# Patient Record
Sex: Female | Born: 1950 | Race: White | Hispanic: No | Marital: Married | Smoking: Never smoker
Health system: Southern US, Community
[De-identification: ages and names within clinical notes are randomized; demographics above are authoritative.]

## PROBLEM LIST (undated history)

## (undated) DIAGNOSIS — M5126 Other intervertebral disc displacement, lumbar region: Secondary | ICD-10-CM

## (undated) DIAGNOSIS — M1990 Unspecified osteoarthritis, unspecified site: Secondary | ICD-10-CM

## (undated) DIAGNOSIS — I10 Essential (primary) hypertension: Secondary | ICD-10-CM

## (undated) DIAGNOSIS — I480 Paroxysmal atrial fibrillation: Secondary | ICD-10-CM

## (undated) DIAGNOSIS — I509 Heart failure, unspecified: Secondary | ICD-10-CM

## (undated) DIAGNOSIS — E119 Type 2 diabetes mellitus without complications: Secondary | ICD-10-CM

## (undated) HISTORY — PX: ABDOMINAL HYSTERECTOMY: SHX81

## (undated) HISTORY — DX: Paroxysmal atrial fibrillation: I48.0

---

## 1999-09-03 ENCOUNTER — Ambulatory Visit (HOSPITAL_COMMUNITY): Admission: RE | Admit: 1999-09-03 | Discharge: 1999-09-03 | Payer: Self-pay | Admitting: Orthopedic Surgery

## 1999-09-03 ENCOUNTER — Encounter: Payer: Self-pay | Admitting: Orthopedic Surgery

## 2001-07-01 ENCOUNTER — Emergency Department (HOSPITAL_COMMUNITY): Admission: EM | Admit: 2001-07-01 | Discharge: 2001-07-01 | Payer: Self-pay | Admitting: *Deleted

## 2001-07-01 ENCOUNTER — Encounter: Payer: Self-pay | Admitting: *Deleted

## 2005-04-11 ENCOUNTER — Ambulatory Visit (HOSPITAL_COMMUNITY): Admission: RE | Admit: 2005-04-11 | Discharge: 2005-04-11 | Payer: Self-pay | Admitting: Orthopedic Surgery

## 2006-05-20 ENCOUNTER — Emergency Department (HOSPITAL_COMMUNITY): Admission: EM | Admit: 2006-05-20 | Discharge: 2006-05-20 | Payer: Self-pay | Admitting: Emergency Medicine

## 2007-10-25 ENCOUNTER — Ambulatory Visit (HOSPITAL_COMMUNITY): Admission: RE | Admit: 2007-10-25 | Discharge: 2007-10-25 | Payer: Self-pay | Admitting: Orthopaedic Surgery

## 2008-02-15 HISTORY — PX: JOINT REPLACEMENT: SHX530

## 2008-08-14 ENCOUNTER — Emergency Department (HOSPITAL_COMMUNITY): Admission: EM | Admit: 2008-08-14 | Discharge: 2008-08-14 | Payer: Self-pay | Admitting: Emergency Medicine

## 2008-11-17 ENCOUNTER — Inpatient Hospital Stay (HOSPITAL_COMMUNITY): Admission: RE | Admit: 2008-11-17 | Discharge: 2008-11-19 | Payer: Self-pay | Admitting: Orthopedic Surgery

## 2010-02-14 HISTORY — PX: JOINT REPLACEMENT: SHX530

## 2010-05-20 LAB — BASIC METABOLIC PANEL
CO2: 27 mEq/L (ref 19–32)
CO2: 28 mEq/L (ref 19–32)
Calcium: 8.4 mg/dL (ref 8.4–10.5)
Calcium: 8.4 mg/dL (ref 8.4–10.5)
Chloride: 101 mEq/L (ref 96–112)
Creatinine, Ser: 0.53 mg/dL (ref 0.4–1.2)
Creatinine, Ser: 0.59 mg/dL (ref 0.4–1.2)
GFR calc Af Amer: >60 mL/min (ref >60)
GFR calc Af Amer: >60 mL/min (ref >60)
GFR calc non Af Amer: >60 mL/min (ref >60)
Glucose, Bld: 101 mg/dL — ABNORMAL HIGH (ref 70–99)
Glucose, Bld: 94 mg/dL (ref 70–99)
Sodium: 133 mEq/L — ABNORMAL LOW (ref 135–145)

## 2010-05-20 LAB — TYPE AND SCREEN
ABO/RH(D): A POS
Antibody Screen: NEGATIVE

## 2010-05-20 LAB — CBC
MCHC: 33.9 g/dL (ref 30.0–36.0)
MCHC: 34 g/dL (ref 30.0–36.0)
MCV: 90.6 fL (ref 78.0–100.0)
RBC: 3.48 MIL/uL — ABNORMAL LOW (ref 3.87–5.11)
RBC: 3.82 MIL/uL — ABNORMAL LOW (ref 3.87–5.11)
RDW: 12.1 % (ref 11.5–15.5)
RDW: 12.5 % (ref 11.5–15.5)

## 2010-05-20 LAB — GLUCOSE, CAPILLARY: Glucose-Capillary: 107 mg/dL — ABNORMAL HIGH (ref 70–99)

## 2010-05-21 LAB — URINALYSIS, ROUTINE W REFLEX MICROSCOPIC
Bilirubin Urine: NEGATIVE
Nitrite: NEGATIVE
Specific Gravity, Urine: 1.021 (ref 1.005–1.030)
Urobilinogen, UA: 1 mg/dL (ref 0.0–1.0)

## 2010-05-21 LAB — BASIC METABOLIC PANEL
Calcium: 9.3 mg/dL (ref 8.4–10.5)
Creatinine, Ser: 0.58 mg/dL (ref 0.4–1.2)
GFR calc Af Amer: >60 mL/min (ref >60)
GFR calc non Af Amer: >60 mL/min (ref >60)
Sodium: 137 mEq/L (ref 135–145)

## 2010-05-21 LAB — PROTIME-INR: INR: 1 (ref 0.00–1.49)

## 2010-05-21 LAB — DIFFERENTIAL
Basophils Relative: 1 % (ref 0–1)
Lymphocytes Relative: 24 % (ref 12–46)
Lymphs Abs: 1.7 10*3/uL (ref 0.7–4.0)
Monocytes Relative: 6 % (ref 3–12)
Neutro Abs: 4.8 10*3/uL (ref 1.7–7.7)
Neutrophils Relative %: 67 % (ref 43–77)

## 2010-05-21 LAB — APTT: aPTT: 27 seconds (ref 24–37)

## 2010-05-21 LAB — CBC
Hemoglobin: 14.2 g/dL (ref 12.0–15.0)
RBC: 4.59 MIL/uL (ref 3.87–5.11)
WBC: 7.2 10*3/uL (ref 4.0–10.5)

## 2010-05-23 LAB — COMPREHENSIVE METABOLIC PANEL
ALT: 26 U/L (ref 0–35)
AST: 27 U/L (ref 0–37)
CO2: 26 mEq/L (ref 19–32)
Calcium: 10.1 mg/dL (ref 8.4–10.5)
GFR calc Af Amer: >60 mL/min (ref >60)
GFR calc non Af Amer: >60 mL/min (ref >60)
Sodium: 141 mEq/L (ref 135–145)
Total Protein: 7.2 g/dL (ref 6.0–8.3)

## 2010-05-23 LAB — DIFFERENTIAL
Eosinophils Absolute: 0.1 10*3/uL (ref 0.0–0.7)
Eosinophils Relative: 1 % (ref 0–5)
Lymphs Abs: 1.5 10*3/uL (ref 0.7–4.0)
Monocytes Relative: 3 % (ref 3–12)

## 2010-05-23 LAB — URINALYSIS, ROUTINE W REFLEX MICROSCOPIC
Bilirubin Urine: NEGATIVE
Glucose, UA: NEGATIVE mg/dL
Hgb urine dipstick: NEGATIVE
Ketones, ur: NEGATIVE mg/dL
pH: 6.5 (ref 5.0–8.0)

## 2010-05-23 LAB — CBC
MCHC: 34.2 g/dL (ref 30.0–36.0)
RBC: 4.82 MIL/uL (ref 3.87–5.11)

## 2010-11-25 ENCOUNTER — Other Ambulatory Visit: Payer: Self-pay | Admitting: Orthopedic Surgery

## 2010-11-25 ENCOUNTER — Encounter (HOSPITAL_COMMUNITY): Payer: BC Managed Care – PPO

## 2010-11-25 LAB — PROTIME-INR: INR: 0.99 (ref 0.00–1.49)

## 2010-11-25 LAB — CBC
HCT: 41.8 % (ref 36.0–46.0)
Hemoglobin: 14.1 g/dL (ref 12.0–15.0)
MCHC: 33.7 g/dL (ref 30.0–36.0)
RBC: 4.84 MIL/uL (ref 3.87–5.11)

## 2010-11-25 LAB — DIFFERENTIAL
Basophils Absolute: 0 10*3/uL (ref 0.0–0.1)
Lymphocytes Relative: 27 % (ref 12–46)
Monocytes Absolute: 0.6 10*3/uL (ref 0.1–1.0)
Monocytes Relative: 7 % (ref 3–12)
Neutro Abs: 5.7 10*3/uL (ref 1.7–7.7)

## 2010-11-25 LAB — BASIC METABOLIC PANEL
Calcium: 9.4 mg/dL (ref 8.4–10.5)
Chloride: 103 mEq/L (ref 96–112)
Creatinine, Ser: 0.58 mg/dL (ref 0.50–1.10)
GFR calc Af Amer: >90 mL/min (ref >90)
GFR calc non Af Amer: >90 mL/min (ref >90)

## 2010-11-25 LAB — URINALYSIS, ROUTINE W REFLEX MICROSCOPIC
Bilirubin Urine: NEGATIVE
Hgb urine dipstick: NEGATIVE
Nitrite: NEGATIVE
Specific Gravity, Urine: 1.027 (ref 1.005–1.030)
pH: 5.5 (ref 5.0–8.0)

## 2010-11-25 LAB — SURGICAL PCR SCREEN: MRSA, PCR: NEGATIVE

## 2010-11-29 ENCOUNTER — Inpatient Hospital Stay (HOSPITAL_COMMUNITY)
Admission: RE | Admit: 2010-11-29 | Discharge: 2010-12-01 | DRG: 209 | Disposition: A | Discharge status: Home Health | Payer: BC Managed Care – PPO | Source: Ambulatory Visit | Attending: Orthopedic Surgery | Admitting: Orthopedic Surgery

## 2010-11-29 DIAGNOSIS — Z01812 Encounter for preprocedural laboratory examination: Secondary | ICD-10-CM

## 2010-11-29 DIAGNOSIS — I10 Essential (primary) hypertension: Secondary | ICD-10-CM | POA: Diagnosis present

## 2010-11-29 DIAGNOSIS — M1710 Unilateral primary osteoarthritis, unspecified knee: Principal | ICD-10-CM | POA: Diagnosis present

## 2010-11-29 DIAGNOSIS — Z96649 Presence of unspecified artificial hip joint: Secondary | ICD-10-CM

## 2010-11-29 LAB — TYPE AND SCREEN
ABO/RH(D): A POS
Antibody Screen: NEGATIVE

## 2010-11-29 NOTE — H&P (Signed)
  [name], [name]               ACCOUNT NO.:  [id number]  MEDICAL RECORD NO.:  [id number]  LOCATION:                               FACILITY:  [facility]  PHYSICIAN:  Madlyn Frankel. Charlann Boxer, M.D.  DATE OF BIRTH:  1950-12-02  DATE OF ADMISSION:  11/29/2010 DATE OF DISCHARGE:                             HISTORY & PHYSICAL   CHIEF COMPLAINT:  Right knee osteoarthritis.  HISTORY OF PRESENT ILLNESS:  This is a 60-year lady with a history of osteoarthritis of right knee with failure of conservative treatment to manage her pain.  After discussion of treatments, benefits, risks and options, the patient now scheduled for total knee arthroplasty of the right knee.  Note that she is a candidate for tranexamic acid and will received that in preop.  She will be going home after surgery.  Her medical doctor is Dr. Herb Grays.  DRUG ALLERGIES:  None.  CURRENT MEDICATIONS: 1. Lisinopril once a day. 2. Relafen b.i.d.  SERIOUS MEDICAL ILLNESSES:  Include hypertension.  PREVIOUS SURGERIES:  Include right knee arthroscopy, total abdominal hysterectomy, and right total hip arthroplasty.  FAMILY HISTORY:  Unknown as she was adopted.  SOCIAL HISTORY:  The patient is married.  She works as a Chiropodist.  She does not smoke and does not drink.  REVIEW OF SYSTEMS:  CENTRAL NERVOUS SYSTEM:  Positive for impaired vision, otherwise negative.  PULMONARY:  Negative shortness of breath, PND, or orthopnea.  CARDIOVASCULAR:  Positive for hypertension. Negative for chest pain or palpitation.  GI:  Negative for ulcers or hepatitis.  GU:  Negative for urinary tract difficulty. MUSCULOSKELETAL:  Positive in HPI.  PHYSICAL EXAMINATION:  VITAL SIGNS:  BP 148/78, respirations 16, pulse 68 and regular. GENERAL APPEARANCE:  This is a well-developed and well-nourished lady, in no acute distress. HEENT:  Head normocephalic.  Nose patent.  Ears patent.  Pupils are equal, round, and reactive to light.   Throat without injection. NECK:  Supple without adenopathy.  Carotids 2+ without bruit. CHEST:  Clear to auscultation.  No rales or rhonchi.  Respirations 16. HEART:  Pulse 68 beats per minute without murmur. ABDOMEN:  Soft with active bowel sounds.  No masses or organomegaly. NEUROLOGIC:  The patient is alert and oriented to time, place, and person.  Cranial nerves II through XII grossly intact. EXTREMITIES:  Show the right knee with 2 degrees from full extension, further flexion of 125 degrees.  There is crepitation throughout the range of motion.  Neurovascular status intact.  IMPRESSION:  Right knee osteoarthritis.  PLAN:  Total knee arthroplasty of right knee.     Jaquelyn Bitter. Chabon, P.A.   ______________________________ Madlyn Frankel Charlann Boxer, M.D.    SJC/MEDQ  D:  11/17/2010  T:  11/17/2010  Job:  [id number]  Electronically Signed by Jodene Nam P.A. on 11/24/2010 08:14:59 AM Electronically Signed by Durene Romans M.D. on 11/29/2010 08:52:24 AM

## 2010-11-30 LAB — BASIC METABOLIC PANEL
Calcium: 8.6 mg/dL (ref 8.4–10.5)
Creatinine, Ser: <0.47 mg/dL — ABNORMAL LOW (ref 0.50–1.10)
Sodium: 136 mEq/L (ref 135–145)

## 2010-11-30 LAB — CBC
MCH: 29.7 pg (ref 26.0–34.0)
MCHC: 34.4 g/dL (ref 30.0–36.0)
Platelets: 141 10*3/uL — ABNORMAL LOW (ref 150–400)
RBC: 4.18 MIL/uL (ref 3.87–5.11)
RDW: 13.1 % (ref 11.5–15.5)

## 2010-12-01 LAB — CBC
HCT: 33.5 % — ABNORMAL LOW (ref 36.0–46.0)
Hemoglobin: 11.5 g/dL — ABNORMAL LOW (ref 12.0–15.0)
MCH: 29.3 pg (ref 26.0–34.0)
MCHC: 34.3 g/dL (ref 30.0–36.0)
MCV: 85.5 fL (ref 78.0–100.0)
Platelets: 134 10*3/uL — ABNORMAL LOW (ref 150–400)
RBC: 3.92 MIL/uL (ref 3.87–5.11)
RDW: 13 % (ref 11.5–15.5)
WBC: 9 10*3/uL (ref 4.0–10.5)

## 2010-12-01 LAB — BASIC METABOLIC PANEL
CO2: 30 mEq/L (ref 19–32)
Chloride: 98 mEq/L (ref 96–112)
GFR calc non Af Amer: >90 mL/min (ref >90)
Glucose, Bld: 100 mg/dL — ABNORMAL HIGH (ref 70–99)
Potassium: 4.6 mEq/L (ref 3.5–5.1)
Sodium: 132 mEq/L — ABNORMAL LOW (ref 135–145)

## 2010-12-06 NOTE — Discharge Summary (Signed)
[name], [name]               ACCOUNT NO.:  [id number]  MEDICAL RECORD NO.:  [id number]  LOCATION:  1614                         FACILITY:  [facility]  PHYSICIAN:  Madlyn Frankel. Charlann Boxer, M.D.  DATE OF BIRTH:  09-13-1950  DATE OF ADMISSION:  11/29/2010 DATE OF DISCHARGE:  12/01/2010                              DISCHARGE SUMMARY   PROCEDURE:  Right total knee replacement.  ATTENDING PHYSICIAN:  Madlyn Frankel. Charlann Boxer, M.D.  ADMITTING DIAGNOSIS:  Right knee osteoarthritis.  DISCHARGE DIAGNOSES: 1. Status post right total knee arthroplasty. 2. Hypertension.  HISTORY OF PRESENT ILLNESS:  [name] is a 60 year old lady with a history of osteoarthritis of Stacy right knee.  [name] has tried several conservative treatments, all of which failed to manage her pain. X-rays in Stacy clinic do show osteoarthritis of Stacy right knee.  Various options were discussed with [name].  Due to failure of conservative treatment and Stacy findings on x-ray, [name] wished to proceed with surgery.  Risks, benefits, and expectations of procedure were discussed with [name].  [name] understands risks, benefits, and expectations and wishes to proceed with a right total knee arthroplasty per Dr. Charlann Boxer.  HOSPITAL COURSE:  [name] underwent Stacy above-stated procedure on October 30, 2010.  [name] tolerated Stacy procedure well, was brought to Stacy recovery room in good condition and subsequently to Stacy floor.  Postop day 1, November 30, 2010, [name] doing well, and was okay.  Afebrile, vital signs stable.  H and H is 12.4/36.0.  Distally neurovascular intact.  Dressings appear clean, dry, and intact.  Hemovac drain was removed.  IV access with saline lock.  [name] has physical therapy.  On postop day 2, December 01, 2010, [name] doing well.  No events. Pain is controlled with medication.  H and H is 11.5/32.5.  She is afebrile.  Vital signs stable.  Dressings appear clean,  dry, and intact. She is distally neurovascularly intact.  [name] had physical therapy and felt to be doing well enough to be discharged home.  DISCHARGE CONDITION:  Good.  DISCHARGE INSTRUCTIONS:  [name] will be discharged home with home health physical therapy.  [name] will be weightbearing as tolerated.  [name] maintain her surgical dressing for about 8 days after which time she will replace with gauze and tape.  [name] is to keep Stacy area dry and clean until followup.  Follow up with [facility] in 2 weeks.  [name] is to call with any questions or concerns.  DISCHARGE MEDICATIONS: 1. Enteric-coated aspirin 325 mg 1 p.o. b.i.d. for 4 weeks. 2. Tylenol 1000 mg q.8 hours p.r.n. pain. 3. Benadryl 25 mg 1 p.o. q.4 hours p.r.n. 4. Colace 100 mg 1 p.o. b.i.d. constipation. 5. Iron sulfate 325 mg 1 p.o. t.i.d. for 2 to 3 weeks. 6. Robaxin 500 mg 1 p.o. q.6 hours p.r.n. muscle spasms. 7. Oxycodone 5 mg 1 to 3 every 4 hours p.r.n. pain. 8. MiraLax 17 g p.o. q. day Constipation. 9. Lisinopril/hydrochlorothiazide 20/12.5 mg 1 p.o. q.a.m.    ______________________________ Lanney Gins, PA   ______________________________ Madlyn Frankel. Charlann Boxer, M.D.    MB/MEDQ  D:  12/01/2010  T:  12/01/2010  Job:  [id number]  cc:   Tammy R. Collins [place], M.D. Fax: [phone]  Electronically Signed by Lanney Gins PA on 12/01/2010 05:00:26 PM Electronically Signed by Durene Romans M.D. on 12/06/2010 12:38:29 PM

## 2010-12-06 NOTE — Op Note (Signed)
[name], [name]               ACCOUNT NO.:  [id number]  MEDICAL RECORD NO.:  [id number]  LOCATION:  1614                         FACILITY:  [facility]  PHYSICIAN:  Madlyn Frankel. Charlann Boxer, M.D.  DATE OF BIRTH:  [birth year]  DATE OF PROCEDURE:  11/29/2010 DATE OF DISCHARGE:                              OPERATIVE REPORT   PREOPERATIVE DIAGNOSIS:  Right knee osteoarthritis.  POSTOPERATIVE DIAGNOSIS:  Right knee osteoarthritis.  PROCEDURE:  Right total knee replacement.  COMPONENTS USED:  DePuy rotating platform stabilized knee system with size 3 femur, 2.5 tibia, 12.5 insert, and 38 patellar button.  SURGEON:  Madlyn Frankel. Charlann Boxer, M.D.  ASSISTANT:  Lanney Gins, PA-C.  ANESTHESIA:  Spinal.  SPECIMENS:  None.  COMPLICATION:  None.  DRAINS:  One Hemovac.  TOURNIQUET TIME:  26 minutes at 250 mmHg.  INDICATION FOR PROCEDURE:  Ms. [name] is a 60 year old female with bilateral knee osteoarthritis.  Radiographically, there is significant bone-on-bone changes.  She had failed conservative measures and was ready to proceed with more definitive measures.  After reviewing the risks of infection, DVT, component failure, need for revision surgery, she was at this point ready to pursue more definitive measures.  Consent was obtained for benefit of pain, relief for knee replacement.  PROCEDURE IN DETAIL:  The patient was brought to the operative theater. Once adequate anesthesia, preoperative antibiotics, Ancef administered, she was positioned supine with a right thigh tourniquet placed.  The right lower extremity was prepped and draped in sterile fashion.  Time- out was performed identifying the patient, planned procedure, and extremity.  Leg was exsanguinated, tourniquet elevated to 250 mmHg.  Following a midline incision, a proximal median arthrotomy and initial exposure and debridement was carried out.  First attention was directed to patella, noted significant bone-on-bone changes to  the medial compartment, patellofemoral compartment, and lateral compartment.  I resected down to about 13 mm of the patella.  This cut surface seemed to be best fit with a size 38 patellar button.  Lug holes were drilled and a metal shim placed to protect the patella from retractors and saw blades.  Attention was now directed to the femur.  Femoral canal was opened with a drill, irrigated to try to prevent fat emboli.  An intramedullary rod was passed at 3 degrees of valgus, I resected 10 mm of bone off the distal femur.  Following this resection, the tibia was subluxated anteriorly, allowing for cruciate and meniscus debridement.  Using extramedullary guide, I resected and measured 10 mm bone of the proximal lateral tibia of the less affected side.  I then checked to confirm the gap was stable medially and laterally, and seemed to be balanced at this point.  I then checked the alignment using alignment rod, found that it was nice and stable __________ down the tibial crest.  At this point, we sized the femur to be a size 3 in anterior-posterior dimension.  The size 3 rotation block was then pinned into position with anterior reference using the C-clamp to set rotation of the proximal tibial cut.  The four-in-one cutting block was then pinned into position.  Anterior, posterior, and chamfer cuts were made without difficulty, no notching.  Final box cut was made off the lateral aspect of distal femur. Following this resection, the tibia was re-subluxated anteriorly and the tibial cut surface seemed to be best fit with size 2.5 tray was pinned into position, drilled, and keel punched.  Trial reduction now carried out with 3 femur, 2.5 tibia, and 10 mm insert and with this, I felt that it was just a little bit of hyper extension, but was stable from extension to flexion.  The patella tracked through the __________ without application of pressure.  At this point, trial components  were removed from the knee.  The synovial capsule junction of the knee was injected with 0.25% Marcaine with epinephrine and 1 mL of Toradol.  Final components were opened. The knee was irrigated with normal saline solution and pulse lavaged.  The final components were then cemented onto clean and dried cut surfaces of bone with the knee brought into extension with a 12.5 insert, with this the knee came to full extension.  Extruded cement was removed.  Once the cement had fully cured, the excess cement was removed throughout the knee, the final 12.5 insert was chosen.  The final insert was chosen and placed into the knee.  The knee was re-irrigated with saline solution and pulse lavaged.  At this point, a medium Hemovac drain was placed deep with extensor mechanism, was then reapproximated using #1 Vicryl with the knee in flexion.  The remainder of the wound was closed with 2-0 Vicryl and running 4-0 Monocryl.  The knee was cleaned, dried, and dressed sterilely using Dermabond and Aquacel dressing, drain site dressed separately.  The knee was then wrapped in Ace wrap.  She was then brought to recovery room in stable condition, tolerating the procedure well.  Lanney Gins, physician assistant, was present for the entirety of the case, __________ important for preoperative positioning, perioperative retractor management, general facilitation of the case as well as primary wound closure at the end of the case.     Madlyn Frankel Charlann Boxer, M.D.     MDO/MEDQ  D:  11/29/2010  T:  11/29/2010  Job:  [id number]  Electronically Signed by Durene Romans M.D. on 12/06/2010 12:38:24 PM

## 2013-02-21 ENCOUNTER — Other Ambulatory Visit (HOSPITAL_COMMUNITY): Payer: Self-pay | Admitting: Family Medicine

## 2013-02-21 DIAGNOSIS — Z1231 Encounter for screening mammogram for malignant neoplasm of breast: Secondary | ICD-10-CM

## 2013-02-25 ENCOUNTER — Ambulatory Visit (HOSPITAL_COMMUNITY)
Admission: RE | Admit: 2013-02-25 | Discharge: 2013-02-25 | Disposition: A | Discharge status: Home / Self Care | Payer: BC Managed Care – PPO | Source: Ambulatory Visit | Attending: Family Medicine | Admitting: Family Medicine

## 2013-02-25 DIAGNOSIS — Z1231 Encounter for screening mammogram for malignant neoplasm of breast: Secondary | ICD-10-CM | POA: Insufficient documentation

## 2013-02-27 ENCOUNTER — Other Ambulatory Visit: Payer: Self-pay | Admitting: Family Medicine

## 2013-02-27 DIAGNOSIS — R928 Other abnormal and inconclusive findings on diagnostic imaging of breast: Secondary | ICD-10-CM

## 2013-03-06 ENCOUNTER — Ambulatory Visit (HOSPITAL_COMMUNITY)
Admission: RE | Admit: 2013-03-06 | Discharge: 2013-03-06 | Disposition: A | Discharge status: Home / Self Care | Payer: BC Managed Care – PPO | Source: Ambulatory Visit | Attending: Family Medicine | Admitting: Family Medicine

## 2013-03-06 DIAGNOSIS — Z1231 Encounter for screening mammogram for malignant neoplasm of breast: Secondary | ICD-10-CM | POA: Insufficient documentation

## 2013-03-06 DIAGNOSIS — R928 Other abnormal and inconclusive findings on diagnostic imaging of breast: Secondary | ICD-10-CM

## 2013-03-27 ENCOUNTER — Encounter (HOSPITAL_COMMUNITY): Payer: Self-pay | Admitting: Pharmacy Technician

## 2013-03-27 ENCOUNTER — Other Ambulatory Visit (HOSPITAL_COMMUNITY): Payer: Self-pay | Admitting: Orthopedic Surgery

## 2013-03-27 NOTE — Patient Instructions (Addendum)
      Your procedure is scheduled on:  04/04/13  Thursday  Report [facility] at    1000   AM.  Call this number if you have problems the morning of surgery: [phone]       Do not eat food  :After Midnight. Wednesday NIGHT--- MAY HAVE CLEAR LIQUIDS Thursday MORNING UNTIL 0630am- THEN NONE   Take these medicines the morning of surgery with A SIP OF WATER:No regular meds                         May take Hydrocodone if needed   .  Contacts, dentures or partial plates, or metal hairpins  can not be worn to surgery. Your family will be responsible for glasses, dentures, hearing aides while you are in surgery  Leave suitcase in the car. After surgery it may be brought to your room.  For patients admitted to the hospital, checkout time is 11:00 AM day of  discharge.                DO NOT WEAR JEWELRY, LOTIONS, POWDERS, OR PERFUMES.  WOMEN-- DO NOT SHAVE LEGS OR UNDERARMS FOR 48 HOURS BEFORE SHOWERS. MEN MAY SHAVE FACE.  Patients discharged the day of surgery will not be allowed to drive home. IF going home the day of surgery, you must have a driver and someone to stay with you for the first 24 hours  Name and phone number of your driver:        Overnight stay    Then husband                                                            Please read over the following fact sheets that you were given: MRSA Information, Incentive Spirometry Sheet    Paxtyn Boyar  PST 336  [phone]                 FAILURE TO FOLLOW THESE INSTRUCTIONS MAY RESULT IN  CANCELLATION   OF YOUR SURGERY                                                  Patient Signature _____________________________

## 2013-03-27 NOTE — H&P (Signed)
[name] is an 63 y.o. female.   Chief Complaint: back pain HPI: The patient is a 63 year old female who presented with the chief complaint of low back pain. She has been having progressively worsening pain that radiates into her left LE for 6 months. She denies numbness and tingling as well as change in bladder or bowel function. 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   Past Medical History Hypertension  Family History Family history unknown - Adopted  Social History Marital status. married Living situation. live with spouse Tobacco use. never smoker Tobacco / smoke exposure. no Children. 2 Alcohol use. never consumed alcohol Exercise. Exercises monthly; does running / walking Current work status. working full time  Past Surgical History Total Hip Replacement. right Tubal Ligation Arthroscopy of Knee. right Hysterectomy. complete (non-cancerous)   Allergies: No Known Allergies   Current outpatient prescriptions: HYDROcodone-acetaminophen (NORCO/VICODIN) 5-325 MG per tablet, Take 1 tablet by mouth every 6 (six) hours as needed for moderate pain., Disp: , Rfl: ;  lisinopril-hydrochlorothiazide (PRINZIDE,ZESTORETIC) 10-12.5 MG per tablet, Take 1 tablet by mouth every morning., Disp: , Rfl:   Review of Systems  Constitutional: Negative.   HENT: Negative.   Eyes: Negative.   Respiratory: Negative.   Cardiovascular: Negative.   Gastrointestinal: Negative.   Genitourinary: Negative.   Musculoskeletal: Positive for back pain and joint pain. Negative for falls, myalgias and neck pain.  Skin: Negative.   Neurological: Negative.   Endo/Heme/Allergies: Negative.   Psychiatric/Behavioral: Negative.    Vitals Weight: 242 lb Height: 65.5 in Body Surface Area: 2.25 m Body Mass Index: 39.66 kg/m Pulse: 120 (Regular) BP: 183/107 (Sitting, Left Arm, Standard)  Physical Exam  Constitutional: She is oriented to person, place,  and time. She appears well-developed and well-nourished. No distress.  HENT:  Head: Normocephalic and atraumatic.  Right Ear: External ear normal.  Left Ear: External ear normal.  Nose: Nose normal.  Mouth/Throat: Oropharynx is clear and moist.  Eyes: Conjunctivae and EOM are normal.  Cardiovascular: Normal rate, regular rhythm, normal heart sounds and intact distal pulses.   No murmur heard. Respiratory: Effort normal and breath sounds normal. No respiratory distress. She has no wheezes.  GI: Soft. Bowel sounds are normal. She exhibits no distension. There is no tenderness.  Musculoskeletal:       Right hip: Normal.       Left hip: Normal.       Right knee: Normal.       Left knee: Normal.       Lumbar back: She exhibits decreased range of motion, tenderness, pain and spasm.       Right lower leg: She exhibits no tenderness and no swelling.       Left lower leg: She exhibits no tenderness and no swelling.  Positive SLR on the left  Neurological: She is alert and oriented to person, place, and time. She has normal strength and normal reflexes. No sensory deficit.  Skin: No rash noted. She is not diaphoretic. No erythema.  Psychiatric: She has a normal mood and affect. Her behavior is normal.     Assessment/Plan Lumbar disc herniation L5-S1 left She needs a lumbar hemilaminectomy and microdiscectomy at L5-S1 on the left. Risks and benefits of the surgery were discussed with the patient by Dr. Darrelyn Hillock. The possible complications of spinal surgery number one could be infection, which is extremely rare. We do use antibiotics prior to the surgery and during surgery and after surgery.  Number two is always a slight degree of probability that you could develop a blood clot in your leg after any type of surgery and we try our best to prevent that with aspirin post op when it is safe to begin. The third is a dural leak. That is the spinal fluid leak that could occur. At certain rare times the bone  or the disc could literally stick to the dura which is the lining which contains the spinal fluid and we could develop a small tear in that lining which we then patch up. That is an extremely rare complication. The last and final complication is a recurrent disc rupture. That means that you could rupture another small piece of disc later on down the road and there is about a 2% chance of that.   [name] 03/27/2013, 2:02 PM

## 2013-03-28 ENCOUNTER — Encounter (HOSPITAL_COMMUNITY): Payer: Self-pay

## 2013-03-28 ENCOUNTER — Encounter (HOSPITAL_COMMUNITY)
Admission: RE | Admit: 2013-03-28 | Discharge: 2013-03-28 | Disposition: A | Discharge status: Home / Self Care | Payer: BC Managed Care – PPO | Source: Ambulatory Visit | Attending: Orthopedic Surgery | Admitting: Orthopedic Surgery

## 2013-03-28 ENCOUNTER — Ambulatory Visit (HOSPITAL_COMMUNITY)
Admission: RE | Admit: 2013-03-28 | Discharge: 2013-03-28 | Disposition: A | Discharge status: Home / Self Care | Payer: BC Managed Care – PPO | Source: Ambulatory Visit | Attending: Surgical | Admitting: Surgical

## 2013-03-28 DIAGNOSIS — M5137 Other intervertebral disc degeneration, lumbosacral region: Secondary | ICD-10-CM | POA: Insufficient documentation

## 2013-03-28 DIAGNOSIS — Z01812 Encounter for preprocedural laboratory examination: Secondary | ICD-10-CM | POA: Insufficient documentation

## 2013-03-28 DIAGNOSIS — M51379 Other intervertebral disc degeneration, lumbosacral region without mention of lumbar back pain or lower extremity pain: Secondary | ICD-10-CM | POA: Insufficient documentation

## 2013-03-28 DIAGNOSIS — M545 Low back pain, unspecified: Secondary | ICD-10-CM | POA: Insufficient documentation

## 2013-03-28 DIAGNOSIS — I10 Essential (primary) hypertension: Secondary | ICD-10-CM | POA: Insufficient documentation

## 2013-03-28 DIAGNOSIS — I517 Cardiomegaly: Secondary | ICD-10-CM | POA: Insufficient documentation

## 2013-03-28 DIAGNOSIS — R9431 Abnormal electrocardiogram [ECG] [EKG]: Secondary | ICD-10-CM | POA: Insufficient documentation

## 2013-03-28 DIAGNOSIS — Z01810 Encounter for preprocedural cardiovascular examination: Secondary | ICD-10-CM | POA: Insufficient documentation

## 2013-03-28 HISTORY — DX: Other intervertebral disc displacement, lumbar region: M51.26

## 2013-03-28 HISTORY — DX: Unspecified osteoarthritis, unspecified site: M19.90

## 2013-03-28 HISTORY — DX: Essential (primary) hypertension: I10

## 2013-03-28 LAB — CBC
HEMATOCRIT: 46.7 % — AB (ref 36.0–46.0)
Hemoglobin: 16.6 g/dL — ABNORMAL HIGH (ref 12.0–15.0)
MCH: 30.2 pg (ref 26.0–34.0)
MCHC: 35.5 g/dL (ref 30.0–36.0)
MCV: 85.1 fL (ref 78.0–100.0)
Platelets: 198 10*3/uL (ref 150–400)
RBC: 5.49 MIL/uL — ABNORMAL HIGH (ref 3.87–5.11)
RDW: 13.2 % (ref 11.5–15.5)
WBC: 8.4 10*3/uL (ref 4.0–10.5)

## 2013-03-28 LAB — URINALYSIS, ROUTINE W REFLEX MICROSCOPIC
Bilirubin Urine: NEGATIVE
Glucose, UA: NEGATIVE mg/dL
Hgb urine dipstick: NEGATIVE
Ketones, ur: NEGATIVE mg/dL
Leukocytes, UA: NEGATIVE
Nitrite: NEGATIVE
Protein, ur: NEGATIVE mg/dL
Specific Gravity, Urine: 1.006 (ref 1.005–1.030)
Urobilinogen, UA: 0.2 mg/dL (ref 0.0–1.0)
pH: 7 (ref 5.0–8.0)

## 2013-03-28 LAB — COMPREHENSIVE METABOLIC PANEL
ALT: 24 U/L (ref 0–35)
AST: 24 U/L (ref 0–37)
Albumin: 4.2 g/dL (ref 3.5–5.2)
Alkaline Phosphatase: 74 U/L (ref 39–117)
BUN: 10 mg/dL (ref 6–23)
CO2: 23 mEq/L (ref 19–32)
Calcium: 9.4 mg/dL (ref 8.4–10.5)
Chloride: 96 mEq/L (ref 96–112)
Creatinine, Ser: 0.69 mg/dL (ref 0.50–1.10)
GFR calc Af Amer: >90 mL/min (ref >90)
GFR calc non Af Amer: >90 mL/min (ref >90)
Glucose, Bld: 101 mg/dL — ABNORMAL HIGH (ref 70–99)
Potassium: 4 mEq/L (ref 3.7–5.3)
Sodium: 135 mEq/L — ABNORMAL LOW (ref 137–147)
Total Bilirubin: 0.6 mg/dL (ref 0.3–1.2)
Total Protein: 7.4 g/dL (ref 6.0–8.3)

## 2013-03-28 LAB — APTT: aPTT: 34 seconds (ref 24–37)

## 2013-03-28 LAB — PROTIME-INR
INR: 1.01 (ref 0.00–1.49)
Prothrombin Time: 13.1 seconds (ref 11.6–15.2)

## 2013-03-28 LAB — SURGICAL PCR SCREEN
MRSA, PCR: NEGATIVE
Staphylococcus aureus: NEGATIVE

## 2013-03-28 NOTE — Progress Notes (Signed)
[facility]ax[facility] CBC [facility]

## 2013-04-04 ENCOUNTER — Encounter (HOSPITAL_COMMUNITY): Payer: Self-pay | Admitting: *Deleted

## 2013-04-04 ENCOUNTER — Encounter (HOSPITAL_COMMUNITY): Payer: BC Managed Care – PPO | Admitting: Certified Registered Nurse Anesthetist

## 2013-04-04 ENCOUNTER — Ambulatory Visit (HOSPITAL_COMMUNITY): Payer: BC Managed Care – PPO | Admitting: Certified Registered Nurse Anesthetist

## 2013-04-04 ENCOUNTER — Encounter (HOSPITAL_COMMUNITY)
Admission: RE | Disposition: A | Discharge status: Home / Self Care | Payer: Self-pay | Source: Ambulatory Visit | Attending: Orthopedic Surgery

## 2013-04-04 ENCOUNTER — Ambulatory Visit (HOSPITAL_COMMUNITY): Payer: BC Managed Care – PPO

## 2013-04-04 ENCOUNTER — Ambulatory Visit (HOSPITAL_COMMUNITY)
Admission: RE | Admit: 2013-04-04 | Discharge: 2013-04-05 | Disposition: A | Discharge status: Home / Self Care | Payer: BC Managed Care – PPO | Source: Ambulatory Visit | Attending: Orthopedic Surgery | Admitting: Orthopedic Surgery

## 2013-04-04 DIAGNOSIS — M48061 Spinal stenosis, lumbar region without neurogenic claudication: Secondary | ICD-10-CM | POA: Insufficient documentation

## 2013-04-04 DIAGNOSIS — Z6841 Body Mass Index (BMI) 40.0 and over, adult: Secondary | ICD-10-CM | POA: Insufficient documentation

## 2013-04-04 DIAGNOSIS — M5137 Other intervertebral disc degeneration, lumbosacral region: Secondary | ICD-10-CM | POA: Insufficient documentation

## 2013-04-04 DIAGNOSIS — M216X9 Other acquired deformities of unspecified foot: Secondary | ICD-10-CM | POA: Insufficient documentation

## 2013-04-04 DIAGNOSIS — I10 Essential (primary) hypertension: Secondary | ICD-10-CM | POA: Insufficient documentation

## 2013-04-04 DIAGNOSIS — M519 Unspecified thoracic, thoracolumbar and lumbosacral intervertebral disc disorder: Secondary | ICD-10-CM | POA: Insufficient documentation

## 2013-04-04 DIAGNOSIS — Z96649 Presence of unspecified artificial hip joint: Secondary | ICD-10-CM | POA: Insufficient documentation

## 2013-04-04 DIAGNOSIS — M5126 Other intervertebral disc displacement, lumbar region: Secondary | ICD-10-CM | POA: Insufficient documentation

## 2013-04-04 DIAGNOSIS — M1610 Unilateral primary osteoarthritis, unspecified hip: Secondary | ICD-10-CM | POA: Insufficient documentation

## 2013-04-04 DIAGNOSIS — M48062 Spinal stenosis, lumbar region with neurogenic claudication: Secondary | ICD-10-CM

## 2013-04-04 DIAGNOSIS — R29898 Other symptoms and signs involving the musculoskeletal system: Secondary | ICD-10-CM | POA: Insufficient documentation

## 2013-04-04 DIAGNOSIS — Z90710 Acquired absence of both cervix and uterus: Secondary | ICD-10-CM | POA: Insufficient documentation

## 2013-04-04 DIAGNOSIS — M169 Osteoarthritis of hip, unspecified: Secondary | ICD-10-CM | POA: Insufficient documentation

## 2013-04-04 DIAGNOSIS — Z79899 Other long term (current) drug therapy: Secondary | ICD-10-CM | POA: Insufficient documentation

## 2013-04-04 DIAGNOSIS — M51379 Other intervertebral disc degeneration, lumbosacral region without mention of lumbar back pain or lower extremity pain: Secondary | ICD-10-CM | POA: Insufficient documentation

## 2013-04-04 DIAGNOSIS — Z9851 Tubal ligation status: Secondary | ICD-10-CM | POA: Insufficient documentation

## 2013-04-04 DIAGNOSIS — M129 Arthropathy, unspecified: Secondary | ICD-10-CM | POA: Insufficient documentation

## 2013-04-04 HISTORY — PX: HEMI-MICRODISCECTOMY LUMBAR LAMINECTOMY LEVEL 1: SHX5846

## 2013-04-04 SURGERY — HEMI-MICRODISCECTOMY LUMBAR LAMINECTOMY LEVEL 1
Anesthesia: General | Site: Back | Laterality: Left

## 2013-04-04 MED ORDER — FLEET ENEMA 7-19 GM/118ML RE ENEM: 1.0000 | ENEMA | Freq: Once | RECTAL | Status: AC | PRN

## 2013-04-04 MED ORDER — METHOCARBAMOL 100 MG/ML IJ SOLN
500.0000 mg | Freq: Four times a day (QID) | INTRAVENOUS | Status: DC | PRN
  Administered 2013-04-04: 500 mg via INTRAVENOUS
  Filled 2013-04-04: qty 5

## 2013-04-04 MED ORDER — PROMETHAZINE HCL 25 MG/ML IJ SOLN
INTRAMUSCULAR | Status: AC
  Filled 2013-04-04: qty 1

## 2013-04-04 MED ORDER — ACETAMINOPHEN 325 MG PO TABS: 650.0000 mg | ORAL_TABLET | ORAL | Status: DC | PRN

## 2013-04-04 MED ORDER — HYDROMORPHONE HCL PF 1 MG/ML IJ SOLN
0.2500 mg | INTRAMUSCULAR | Status: DC | PRN
  Administered 2013-04-04 (×4): 0.5 mg via INTRAVENOUS

## 2013-04-04 MED ORDER — LISINOPRIL 10 MG PO TABS
10.0000 mg | ORAL_TABLET | Freq: Every day | ORAL | Status: DC
  Administered 2013-04-05: 10 mg via ORAL
  Filled 2013-04-04: qty 1

## 2013-04-04 MED ORDER — LACTATED RINGERS IV SOLN: INTRAVENOUS | Status: DC

## 2013-04-04 MED ORDER — ONDANSETRON HCL 4 MG/2ML IJ SOLN: 4.0000 mg | INTRAMUSCULAR | Status: DC | PRN

## 2013-04-04 MED ORDER — HYDROMORPHONE HCL PF 1 MG/ML IJ SOLN: 0.5000 mg | INTRAMUSCULAR | Status: DC | PRN

## 2013-04-04 MED ORDER — FENTANYL CITRATE 0.05 MG/ML IJ SOLN
INTRAMUSCULAR | Status: AC
Start: 2013-04-04 — End: 2013-04-04
  Filled 2013-04-04: qty 5

## 2013-04-04 MED ORDER — HYDROCHLOROTHIAZIDE 12.5 MG PO CAPS
12.5000 mg | ORAL_CAPSULE | Freq: Every day | ORAL | Status: DC
  Administered 2013-04-05: 12.5 mg via ORAL
  Filled 2013-04-04: qty 1

## 2013-04-04 MED ORDER — POLYETHYLENE GLYCOL 3350 17 G PO PACK: 17.0000 g | PACK | Freq: Every day | ORAL | Status: DC | PRN

## 2013-04-04 MED ORDER — PROPOFOL 10 MG/ML IV BOLUS
INTRAVENOUS | Status: AC
  Filled 2013-04-04: qty 20

## 2013-04-04 MED ORDER — LISINOPRIL-HYDROCHLOROTHIAZIDE 10-12.5 MG PO TABS: 1.0000 | ORAL_TABLET | Freq: Every morning | ORAL | Status: DC

## 2013-04-04 MED ORDER — HYDROMORPHONE HCL PF 1 MG/ML IJ SOLN
INTRAMUSCULAR | Status: AC
  Filled 2013-04-04: qty 1

## 2013-04-04 MED ORDER — CEFAZOLIN SODIUM 1-5 GM-% IV SOLN
1.0000 g | Freq: Three times a day (TID) | INTRAVENOUS | Status: AC
  Administered 2013-04-04 – 2013-04-05 (×3): 1 g via INTRAVENOUS
  Filled 2013-04-04 (×3): qty 50

## 2013-04-04 MED ORDER — MIDAZOLAM HCL 2 MG/2ML IJ SOLN
INTRAMUSCULAR | Status: AC
  Filled 2013-04-04: qty 2

## 2013-04-04 MED ORDER — METHOCARBAMOL 500 MG PO TABS
500.0000 mg | ORAL_TABLET | Freq: Four times a day (QID) | ORAL | Status: DC | PRN
  Administered 2013-04-04 – 2013-04-05 (×2): 500 mg via ORAL
  Filled 2013-04-04 (×2): qty 1

## 2013-04-04 MED ORDER — THROMBIN 5000 UNITS EX SOLR
OROMUCOSAL | Status: DC | PRN
  Administered 2013-04-04: 13:00:00 via TOPICAL

## 2013-04-04 MED ORDER — PROPOFOL 10 MG/ML IV BOLUS
INTRAVENOUS | Status: DC | PRN
  Administered 2013-04-04: 180 mg via INTRAVENOUS

## 2013-04-04 MED ORDER — BACITRACIN-NEOMYCIN-POLYMYXIN 400-5-5000 EX OINT
TOPICAL_OINTMENT | CUTANEOUS | Status: AC
  Filled 2013-04-04: qty 1

## 2013-04-04 MED ORDER — BACITRACIN-NEOMYCIN-POLYMYXIN 400-5-5000 EX OINT
TOPICAL_OINTMENT | CUTANEOUS | Status: DC | PRN
  Administered 2013-04-04: 1 via TOPICAL

## 2013-04-04 MED ORDER — LACTATED RINGERS IV SOLN
INTRAVENOUS | Status: DC | PRN
  Administered 2013-04-04 (×2): via INTRAVENOUS

## 2013-04-04 MED ORDER — ROCURONIUM BROMIDE 100 MG/10ML IV SOLN
INTRAVENOUS | Status: DC | PRN
  Administered 2013-04-04 (×2): 10 mg via INTRAVENOUS
  Administered 2013-04-04: 50 mg via INTRAVENOUS
  Administered 2013-04-04: 10 mg via INTRAVENOUS

## 2013-04-04 MED ORDER — EPHEDRINE SULFATE 50 MG/ML IJ SOLN
INTRAMUSCULAR | Status: DC | PRN
  Administered 2013-04-04 (×2): 10 mg via INTRAVENOUS

## 2013-04-04 MED ORDER — LIDOCAINE HCL (CARDIAC) 20 MG/ML IV SOLN
INTRAVENOUS | Status: DC | PRN
  Administered 2013-04-04: 80 mg via INTRAVENOUS

## 2013-04-04 MED ORDER — ACETAMINOPHEN 650 MG RE SUPP: 650.0000 mg | RECTAL | Status: DC | PRN

## 2013-04-04 MED ORDER — GLYCOPYRROLATE 0.2 MG/ML IJ SOLN
INTRAMUSCULAR | Status: DC | PRN
  Administered 2013-04-04: 0.6 mg via INTRAVENOUS

## 2013-04-04 MED ORDER — NEOSTIGMINE METHYLSULFATE 1 MG/ML IJ SOLN
INTRAMUSCULAR | Status: DC | PRN
  Administered 2013-04-04: 5 mg via INTRAVENOUS

## 2013-04-04 MED ORDER — FENTANYL CITRATE 0.05 MG/ML IJ SOLN
INTRAMUSCULAR | Status: DC | PRN
  Administered 2013-04-04 (×4): 50 ug via INTRAVENOUS
  Administered 2013-04-04: 100 ug via INTRAVENOUS
  Administered 2013-04-04: 50 ug via INTRAVENOUS

## 2013-04-04 MED ORDER — BUPIVACAINE LIPOSOME 1.3 % IJ SUSP
20.0000 mL | Freq: Once | INTRAMUSCULAR | Status: AC
  Administered 2013-04-04: 20 mL
  Filled 2013-04-04: qty 20

## 2013-04-04 MED ORDER — ONDANSETRON HCL 4 MG/2ML IJ SOLN
INTRAMUSCULAR | Status: AC
  Filled 2013-04-04: qty 2

## 2013-04-04 MED ORDER — ONDANSETRON HCL 4 MG/2ML IJ SOLN
INTRAMUSCULAR | Status: DC | PRN
  Administered 2013-04-04: 4 mg via INTRAVENOUS

## 2013-04-04 MED ORDER — BUPIVACAINE-EPINEPHRINE PF 0.5-1:200000 % IJ SOLN
INTRAMUSCULAR | Status: DC | PRN
  Administered 2013-04-04: 20 mL

## 2013-04-04 MED ORDER — SODIUM CHLORIDE 0.9 % IR SOLN
Status: DC | PRN
  Administered 2013-04-04: 13:00:00

## 2013-04-04 MED ORDER — EPHEDRINE SULFATE 50 MG/ML IJ SOLN
INTRAMUSCULAR | Status: AC
  Filled 2013-04-04: qty 1

## 2013-04-04 MED ORDER — KETOROLAC TROMETHAMINE 30 MG/ML IJ SOLN
30.0000 mg | Freq: Once | INTRAMUSCULAR | Status: AC | PRN
  Administered 2013-04-04: 30 mg via INTRAVENOUS

## 2013-04-04 MED ORDER — MEPERIDINE HCL 50 MG/ML IJ SOLN: 6.2500 mg | INTRAMUSCULAR | Status: DC | PRN

## 2013-04-04 MED ORDER — CHLORHEXIDINE GLUCONATE CLOTH 2 % EX PADS: 6.0000 | MEDICATED_PAD | Freq: Once | CUTANEOUS | Status: DC

## 2013-04-04 MED ORDER — MENTHOL 3 MG MT LOZG
1.0000 | LOZENGE | OROMUCOSAL | Status: DC | PRN
Start: 2013-04-04 — End: 2013-04-05

## 2013-04-04 MED ORDER — LIDOCAINE HCL (CARDIAC) 20 MG/ML IV SOLN
INTRAVENOUS | Status: AC
  Filled 2013-04-04: qty 5

## 2013-04-04 MED ORDER — CEFAZOLIN SODIUM-DEXTROSE 2-3 GM-% IV SOLR
2.0000 g | INTRAVENOUS | Status: AC
  Administered 2013-04-04: 2 g via INTRAVENOUS

## 2013-04-04 MED ORDER — SODIUM CHLORIDE 0.9 % IJ SOLN
INTRAMUSCULAR | Status: AC
  Filled 2013-04-04: qty 10

## 2013-04-04 MED ORDER — OXYCODONE-ACETAMINOPHEN 5-325 MG PO TABS
2.0000 | ORAL_TABLET | ORAL | Status: DC | PRN
  Administered 2013-04-04 – 2013-04-05 (×5): 2 via ORAL
  Filled 2013-04-04 (×5): qty 2

## 2013-04-04 MED ORDER — KETOROLAC TROMETHAMINE 30 MG/ML IJ SOLN
INTRAMUSCULAR | Status: AC
  Filled 2013-04-04: qty 1

## 2013-04-04 MED ORDER — LACTATED RINGERS IV SOLN
INTRAVENOUS | Status: DC
  Administered 2013-04-04: 20:00:00 via INTRAVENOUS

## 2013-04-04 MED ORDER — BISACODYL 10 MG RE SUPP: 10.0000 mg | Freq: Every day | RECTAL | Status: DC | PRN

## 2013-04-04 MED ORDER — CEFAZOLIN SODIUM 1-5 GM-% IV SOLN
1.0000 g | INTRAVENOUS | Status: AC
  Administered 2013-04-04: 1 g via INTRAVENOUS
  Filled 2013-04-04: qty 50

## 2013-04-04 MED ORDER — CEFAZOLIN SODIUM-DEXTROSE 2-3 GM-% IV SOLR
INTRAVENOUS | Status: AC
  Filled 2013-04-04: qty 50

## 2013-04-04 MED ORDER — THROMBIN 5000 UNITS EX SOLR
CUTANEOUS | Status: AC
  Filled 2013-04-04: qty 5000

## 2013-04-04 MED ORDER — PHENOL 1.4 % MT LIQD: 1.0000 | OROMUCOSAL | Status: DC | PRN

## 2013-04-04 MED ORDER — HYDROCODONE-ACETAMINOPHEN 5-325 MG PO TABS
1.0000 | ORAL_TABLET | ORAL | Status: DC | PRN
Start: 2013-04-04 — End: 2013-04-05

## 2013-04-04 MED ORDER — PROMETHAZINE HCL 25 MG/ML IJ SOLN
6.2500 mg | INTRAMUSCULAR | Status: DC | PRN
  Administered 2013-04-04: 12.5 mg via INTRAVENOUS

## 2013-04-04 MED ORDER — BUPIVACAINE-EPINEPHRINE PF 0.5-1:200000 % IJ SOLN
INTRAMUSCULAR | Status: AC
  Filled 2013-04-04: qty 30

## 2013-04-04 MED ORDER — FENTANYL CITRATE 0.05 MG/ML IJ SOLN
INTRAMUSCULAR | Status: AC
  Filled 2013-04-04: qty 2

## 2013-04-04 SURGICAL SUPPLY — 42 items
APL SKNCLS STERI-STRIP NONHPOA (GAUZE/BANDAGES/DRESSINGS) ×1
BAG SPEC THK2 15X12 ZIP CLS (MISCELLANEOUS)
BAG ZIPLOCK 12X15 (MISCELLANEOUS) ×1 IMPLANT
BENZOIN TINCTURE PRP APPL 2/3 (GAUZE/BANDAGES/DRESSINGS) ×3 IMPLANT
CLEANER TIP ELECTROSURG 2X2 (MISCELLANEOUS) ×3 IMPLANT
CONT SPECI 4OZ STER CLIK (MISCELLANEOUS) ×1 IMPLANT
DRAIN PENROSE 18X1/4 LTX STRL (WOUND CARE) IMPLANT
DRAPE LG THREE QUARTER DISP (DRAPES) ×2 IMPLANT
DRAPE MICROSCOPE LEICA (MISCELLANEOUS) ×3 IMPLANT
DRAPE POUCH INSTRU U-SHP 10X18 (DRAPES) ×3 IMPLANT
DRAPE SURG 17X11 SM STRL (DRAPES) ×3 IMPLANT
DRSG ADAPTIC 3X8 NADH LF (GAUZE/BANDAGES/DRESSINGS) ×3 IMPLANT
DURAPREP 26ML APPLICATOR (WOUND CARE) ×3 IMPLANT
ELECT BLADE TIP CTD 4 INCH (ELECTRODE) ×2 IMPLANT
ELECT REM PT RETURN 9FT ADLT (ELECTROSURGICAL) ×3
ELECTRODE REM PT RTRN 9FT ADLT (ELECTROSURGICAL) ×1 IMPLANT
GLOVE BIOGEL PI IND STRL 8 (GLOVE) ×1 IMPLANT
GLOVE BIOGEL PI INDICATOR 8 (GLOVE) ×4
GLOVE ECLIPSE 8.0 STRL XLNG CF (GLOVE) ×10 IMPLANT
GOWN STRL REUS W/TWL XL LVL3 (GOWN DISPOSABLE) ×6 IMPLANT
KIT BASIN OR (CUSTOM PROCEDURE TRAY) ×3 IMPLANT
KIT POSITIONING SURG ANDREWS (MISCELLANEOUS) ×3 IMPLANT
MANIFOLD NEPTUNE II (INSTRUMENTS) ×3 IMPLANT
NDL SPNL 18GX3.5 QUINCKE PK (NEEDLE) ×2 IMPLANT
NEEDLE SPNL 18GX3.5 QUINCKE PK (NEEDLE) ×9 IMPLANT
PAD ABD 8X10 STRL (GAUZE/BANDAGES/DRESSINGS) ×5 IMPLANT
PATTIES SURGICAL .5 X.5 (GAUZE/BANDAGES/DRESSINGS) IMPLANT
PATTIES SURGICAL .75X.75 (GAUZE/BANDAGES/DRESSINGS) IMPLANT
PATTIES SURGICAL 1X1 (DISPOSABLE) IMPLANT
PIN SAFETY NICK PLATE  2 MED (MISCELLANEOUS)
PIN SAFETY NICK PLATE 2 MED (MISCELLANEOUS) IMPLANT
POSITIONER SURGICAL ARM (MISCELLANEOUS) ×1 IMPLANT
SPONGE GAUZE 4X4 12PLY (GAUZE/BANDAGES/DRESSINGS) ×2 IMPLANT
SPONGE LAP 4X18 X RAY DECT (DISPOSABLE) ×2 IMPLANT
SPONGE SURGIFOAM ABS GEL 100 (HEMOSTASIS) ×3 IMPLANT
STAPLER VISISTAT 35W (STAPLE) ×2 IMPLANT
SUT VIC AB 0 CT1 27 (SUTURE) ×3
SUT VIC AB 0 CT1 27XBRD ANTBC (SUTURE) ×1 IMPLANT
SUT VIC AB 1 CT1 27 (SUTURE) ×9
SUT VIC AB 1 CT1 27XBRD ANTBC (SUTURE) ×4 IMPLANT
TOWEL OR 17X26 10 PK STRL BLUE (TOWEL DISPOSABLE) ×4 IMPLANT
TRAY LAMINECTOMY (CUSTOM PROCEDURE TRAY) ×3 IMPLANT

## 2013-04-04 NOTE — Transfer of Care (Signed)
Immediate Anesthesia Transfer of Care Note  Patient: Bo McclintockSusan B Holzheimer  Procedure(s) Performed: Procedure(s): HEMI-  LAMINECTOMY MICRODISCECTOMY OF L5 -S1 ON LEFT   (Left)  Patient Location: PACU  Anesthesia Type:General  Level of Consciousness: awake and alert   Airway & Oxygen Therapy: Patient Spontanous Breathing and Patient connected to face mask oxygen  Post-op Assessment: Report given to PACU RN and Post -op Vital signs reviewed and stable  Post vital signs: Reviewed and stable  Complications: No apparent anesthesia complications

## 2013-04-04 NOTE — Anesthesia Preprocedure Evaluation (Signed)
Anesthesia Evaluation  Patient identified by MRN, date of birth, ID band Patient awake    Reviewed: Allergy & Precautions, H&P , NPO status , Patient's Chart, lab work & pertinent test results  Airway Mallampati: II  TM Distance: >3 FB Neck ROM: Full    Dental no notable dental hx.    Pulmonary neg pulmonary ROS,  breath sounds clear to auscultation  Pulmonary exam normal       Cardiovascular hypertension, Pt. on medications Rhythm:Regular Rate:Normal     Neuro/Psych negative neurological ROS  negative psych ROS   GI/Hepatic negative GI ROS, Neg liver ROS,   Endo/Other  negative endocrine ROS  Renal/GU negative Renal ROS  negative genitourinary   Musculoskeletal negative musculoskeletal ROS (+)   Abdominal   Peds negative pediatric ROS (+)  Hematology negative hematology ROS (+)   Anesthesia Other Findings   Reproductive/Obstetrics negative OB ROS                             Anesthesia Physical Anesthesia Plan  ASA: II  Anesthesia Plan: General   Post-op Pain Management:    Induction: Intravenous  Airway Management Planned: Oral ETT  Additional Equipment:   Intra-op Plan:   Post-operative Plan: Extubation in OR  Informed Consent: I have reviewed the patients History and Physical, chart, labs and discussed the procedure including the risks, benefits and alternatives for the proposed anesthesia with the patient or authorized representative who has indicated his/her understanding and acceptance.   Dental advisory given  Plan Discussed with: CRNA  Anesthesia Plan Comments:         Anesthesia Quick Evaluation  

## 2013-04-04 NOTE — Brief Op Note (Signed)
04/04/2013  2:44 PM  PATIENT:  [name]  63 y.o. 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 SURGEON:  Surgeon(s) and Role:    * Stacy Conesonald A Spiro Ausborn, MD - Primary    * Stacy SchmidtJames P Aplington, MD - Assisting    ASSISTANTS: Stacy KaysJames Aplington MD}   ANESTHESIA:   general  EBL:  Total I/O In: 900 [I.V.:900] Out: -   BLOOD ADMINISTERED:none  DRAINS: none   LOCAL MEDICATIONS USED:  MARCAINE 20cc of 0.50% with Epinephrine at start of case and 20cc of Exparel at end of case.     SPECIMEN:  No Specimen  DISPOSITION OF SPECIMEN:  N/A  COUNTS:  YES  TOURNIQUET:  * No tourniquets in log *  DICTATION: .Other Dictation: Dictation Number [id number]  PLAN OF CARE: Admit for overnight observation  PATIENT DISPOSITION:  PACU - hemodynamically stable.   Delay start of Pharmacological VTE agent (>24hrs) due to surgical blood loss or risk of bleeding: yes

## 2013-04-04 NOTE — Interval H&P Note (Signed)
History and Physical Interval Note:  04/04/2013 12:27 PM  [name]  has presented today for surgery, with the diagnosis of HERNIATED DISC  The various methods of treatment have been discussed with the patient and family. After consideration of risks, benefits and other options for treatment, the patient has consented to  Procedure(s): HEMI-  LAMINECTOMY MICRODISCECTOMY OF L5 -S1 ON LEFT   (Left) as a surgical intervention .  The patient's history has been reviewed, patient examined, no change in status, stable for surgery.  I have reviewed the patient's chart and labs.  Questions were answered to the patient's satisfaction.     Gerilynn Mccullars A

## 2013-04-04 NOTE — Preoperative (Signed)
Beta Blockers   Reason not to administer Beta Blockers:Not Applicable 

## 2013-04-04 NOTE — Anesthesia Postprocedure Evaluation (Signed)
  Anesthesia Post-op Note  Patient: Stacy McclintockSusan B Barmore  Procedure(s) Performed: Procedure(s) (LRB): HEMI-  LAMINECTOMY MICRODISCECTOMY OF L5 -S1 ON LEFT   (Left)  Patient Location: PACU  Anesthesia Type: General  Level of Consciousness: awake and alert   Airway and Oxygen Therapy: Patient Spontanous Breathing  Post-op Pain: mild  Post-op Assessment: Post-op Vital signs reviewed, Patient's Cardiovascular Status Stable, Respiratory Function Stable, Patent Airway and No signs of Nausea or vomiting  Last Vitals:  Filed Vitals:   04/04/13 1530  BP: 127/71  Pulse: 104  Temp: 36.4 C  Resp: 12    Post-op Vital Signs: stable   Complications: No apparent anesthesia complications

## 2013-04-05 ENCOUNTER — Encounter (HOSPITAL_COMMUNITY): Payer: Self-pay | Admitting: Orthopedic Surgery

## 2013-04-05 MED ORDER — METHOCARBAMOL 500 MG PO TABS: 500.0000 mg | ORAL_TABLET | Freq: Four times a day (QID) | ORAL | Status: DC | PRN

## 2013-04-05 MED ORDER — OXYCODONE-ACETAMINOPHEN 5-325 MG PO TABS: 1.0000 | ORAL_TABLET | ORAL | Status: DC | PRN

## 2013-04-05 NOTE — Progress Notes (Signed)
OT Note:  Pt did well with PT and pt/husband feel comfortable with care.  No further OT is needed.  MatherMaryellen Alex Farrell, [place] CarolinaOTR/L [phone] 04/05/2013

## 2013-04-05 NOTE — Evaluation (Signed)
Occupational Therapy Evaluation Patient Details Name: [name] MRN: [id number] DOB: 09/16/1950 Today's Date: 04/05/2013 Time: [id number] OT Time Calculation (min): 30 min  OT Assessment / Plan / Recommendation History of present illness pt was admitted for L5-S1 decompression and 2 level formainotomies   Clinical Impression   Pt was admitted for the above surgery. She will benefit from skilled OT to increase safety and independence with adls.  Pt performs adls with min A but needs cues for back precautions and she needs mod to max A for bed mobility.      OT Assessment  Patient needs continued OT Services    Follow Up Recommendations  Home health OT    Barriers to Discharge      Equipment Recommendations  None recommended by OT    Recommendations for Other Services    Frequency  Min 2X/week    Precautions / Restrictions Precautions Precautions: Back Restrictions Weight Bearing Restrictions: No   Pertinent Vitals/Pain 5-6/10 with movement; premedicated    ADL  Grooming: Wash/dry hands;Supervision/safety Where Assessed - Grooming: Supported standing Upper Body Bathing: Supervision/safety Where Assessed - Upper Body Bathing: Unsupported sitting Lower Body Bathing: Minimal assistance (with AE) Where Assessed - Lower Body Bathing: Supported sit to stand Upper Body Dressing: Minimal assistance (iv) Where Assessed - Upper Body Dressing: Unsupported sitting Lower Body Dressing: Minimal assistance (with ae) Where Assessed - Lower Body Dressing: Supported sit to Pharmacist, hospitalstand Toilet Transfer: Minimal assistance Toilet Transfer Method: Sit to Baristastand Toilet Transfer Equipment: Comfort height toilet;Grab bars Toileting - Clothing Manipulation and Hygiene: Minimal assistance (with toilet aide) Where Assessed - Toileting Clothing Manipulation and Hygiene: Sit to stand from 3-in-1 or toilet Equipment Used: Rolling walker Transfers/Ambulation Related to ADLs: ambulated to bathroom with  min guard:  cues not to reach to move obstacles ADL Comments: pt has AE and uses this.  Educated on adls and back precautions.  Demonstrated stepping into tub but pt is not ready for this.  Bed mobility requires a lot of assistance (first time done)--she usually uses headboard for A.  Cued not to do this and to get in/out of bed as practiced.  Recommended husband fold sheet under her hips to assist with rolling    OT Diagnosis: Generalized weakness  OT Problem List: Decreased strength;Decreased activity tolerance;Decreased knowledge of precautions;Pain OT Treatment Interventions: Self-care/ADL training   OT Goals(Current goals can be found in the care plan section) Acute Rehab OT Goals OT Goal Formulation: With patient Time For Goal Achievement: 04/12/13 Potential to Achieve Goals: Good ADL Goals Pt Will Transfer to Toilet: with supervision;bedside commode;ambulating Additional ADL Goal #1: pt will verbalize 3/3 back precautions Additional ADL Goal #2: pt will perform bed mobility with min A in preparation for adls/3:1 transfers Additional ADL Goal #3: pt will verbalize tub transfer technique  Visit Information  Last OT Received On: 04/05/13 Assistance Needed: +1 History of Present Illness: pt was admitted for L5-S1 decompression and 2 level formainotomies       Prior Functioning     Home Living Family/patient expects to be discharged to:: Private residence Living Arrangements: Spouse/significant other Available Help at Discharge: Family Home Access: Stairs to enter Secretary/administratorntrance Stairs-Number of Steps: 3 Home Equipment: Environmental consultantWalker - 2 wheels;Cane - single point;Bedside commode;Shower seat Prior Function Level of Independence: Independent with assistive device(s) Communication Communication: No difficulties         Vision/Perception     Cognition  Cognition Arousal/Alertness: Awake/alert Behavior During Therapy: WFL for tasks assessed/performed Overall Cognitive  Status:  Within Functional Limits for tasks assessed    Extremity/Trunk Assessment Upper Extremity Assessment Upper Extremity Assessment: Overall WFL for tasks assessed     Mobility Bed Mobility Overal bed mobility: Needs Assistance Bed Mobility: Rolling;Sidelying to Sit;Sit to Sidelying Rolling: Mod assist Sidelying to sit: Max assist Sit to sidelying: Mod assist General bed mobility comments: assist for legs and trunk getting OOB; legs for back to bed Transfers Overall transfer level: Needs assistance Equipment used: Rolling walker (2 wheeled) Transfers: Sit to/from Stand Sit to Stand: Min assist General transfer comment: assist to rise and steady     Exercise     Balance     End of Session OT - End of Session Activity Tolerance: Patient tolerated treatment well Patient left: in bed;with call bell/phone within reach;with family/visitor present Nurse Communication: Mobility status  GO Functional Assessment Tool Used: clinical observation and judgment Functional Limitation: Self care Self Care Current Status (W0981): At least 20 percent but less than 40 percent impaired, limited or restricted Self Care Goal Status (X9147): At least 1 percent but less than 20 percent impaired, limited or restricted   [facility] 04/05/2013, 9:18 AM Marica Otter, OTR/L [phone] 04/05/2013

## 2013-04-05 NOTE — Progress Notes (Signed)
Pt to d/c home. AVS reviewed and "My Chart" discussed with pt. Pt capable of verbalizing medications, dressing changes, signs and symptoms of infection, and follow-up appointments. Remains hemodynamically stable. No signs and symptoms of distress. Educated pt to return to ER in the case of SOB, dizziness, or chest pain.  

## 2013-04-05 NOTE — Progress Notes (Signed)
   CARE MANAGEMENT NOTE 04/05/2013  Patient:  [name],[name]   Account Number:  [id number]  Date Initiated:  04/05/2013  Documentation [facility] by:  [facility]fred[facility]ong  Subjective/Objective Assessment:   L5-S1 decompression and 2 level formainotomies     Action/Plan:   HH OT recommended   Anticipated DC Date:  04/05/2013   Anticipated DC Plan:  HOME/SELF CARE      DC Planning Services  CM consult      Choice offered to / List presented to:             Status of service:  Completed, signed off Medicare Important Message given?   (If response is "NO", the following Medicare IM given date fields will be blank) Date Medicare IM given:   Date Additional Medicare IM given:    Discharge Disposition:  HOME/SELF CARE  Per UR Regulation:    If discussed at Long Length of Stay Meetings, dates discussed:    Comments:  04/05/2013 1455 NCM spoke to pt and states she has RW and 3n1 at home. No HH ordered. Stacy DonningAlesia Josslyn Ciolek RN CCM Case Mgmt phone [phone]

## 2013-04-05 NOTE — Discharge Instructions (Signed)
Change your dressing daily. °Shower only, no tub bath. °You may shower starting Saturday. °Call if any temperatures greater than 101 or any wound complications: [phone] during the day and ask for Dr. Gioffre's nurse, Tammy Johnson. °

## 2013-04-05 NOTE — Op Note (Signed)
[name], [name]               ACCOUNT NO.:  [id number]  MEDICAL RECORD NO.:  [id number]  LOCATION:  1619                         FACILITY:  [facility]  PHYSICIAN:  Georges Lynch. Efton Thomley, M.D.DATE OF BIRTH:  Nov 17, 1950  DATE OF PROCEDURE:  04/04/2013 DATE OF DISCHARGE:                              OPERATIVE REPORT   SURGEON:  Georges Lynch. Darrelyn Hillock, M.D.  ASSISTANT:  Marlowe Kays, M.D.  PREOPERATIVE DIAGNOSES: 1. Soft questionable hard calcified lumbar disk at L5-S1. 2. 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 3. Foraminal stenosis for the S1 root on the left.  Note, all her     symptoms were on the left.  She did have some mild weakness of the     dorsiflexors of the left foot.  That was tested again in the     holding area. 4. Partial foot drop was on the left. 5. Morbid obesity.  POSTOPERATIVE DIAGNOSES: 1. Soft questionable hard calcified lumbar disk at L5-S1. 2. 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 3. Foraminal stenosis for the S1 root on the left.  Note, all her     symptoms were on the left.  She did have some mild weakness of the     dorsiflexors of the left foot.  That was tested again in the     holding area. 4. Partial foot drop was on the left. 5. Morbid obesity.  OPERATION: 1. Decompressive lateral recess at L5-S1, left. 2. Foraminotomies for the S1 root on the left. 3. Foraminotomy for the L5 root on the left.  Note, also we explored     the disk which was a hard calcified disk at L5-S1 on the left.  As     I mentioned in brief summary, we decompressed the lateral recess     for lateral recess stenosis on the left with foraminotomies on the     left.  All her symptoms were on the left.  She had a partial     footdrop on the left.  PROCEDURE:  Under general anesthesia, routine orthopedic prep and draping was carried out with the patient on the spinal frame.  Note, at this time, her x-rays was up.  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  This pain that she had in her back was not related to the left hip, that is a separate issue.  After appropriate time-out was carried out and after I marked the appropriate left side of the back in the holding area, 2 needles were placed in the back for localization purposes.  X-ray was taken.  Incision then was made over L5-S1, extended proximally and distally.  At this particular point, the bleeders identified and cauterized.  Self-retaining retractors were inserted.  She had 3 g of IV Ancef.  The incision was carried down through the deep fascia and the muscle was separated from the lamina and spinous processes bilaterally. Another x-ray was taken.  I then inserted the [facility] retractors.  I went down and cleaned the L5-S1 interspace up quite nicely.  Good hemostasis was maintained.  I then  started my laminectomy proximally and distally, and then removed the ligamentum flavum over the L5-S1 space. We went far out laterally to decompress the lateral recess.  The lateral recess was tight as well.  We first went down and identified the S1 root, and we did a foraminotomy on the left.  I then went up and identified the L5 root and did a foraminotomy there as well.  I was able to easily pass the hockey-stick out for the L5 root and the S1 root. Note, she had a partial footdrop on the left.  Following that, another x- ray was taken.  We brought the microscope in and examined the disk, it was very hard.  It was calcified.  Needle barely could penetrate that disk, so we did not feel that this disk was exerting any major pressure on the nerve root.  This was more of a stenotic area.  Following that, I thoroughly irrigated out the area, loosely applied some thrombin-soaked Gelfoam and closed the wound layers in usual fashion.  I left a small deep distal and proximal part of the wound open for drainage purposes. Note, she did not have a Foley  catheter.  The muscle and soft tissue was infiltrated with Exparel 20 mL postop.  At the beginning of the procedure, I injected the subcu muscle with 20 mL of 0.5% Marcaine with epinephrine for bleeding to prevent any bleeding.  After the procedure was done, the wound was closed, I mentioned, in usual fashion.  Skin was closed with metal staples.  A sterile Neosporin dressing was applied.          ______________________________ Georges Lynchonald A. Darrelyn HillockGioffre, M.D.     RAG/MEDQ  D:  04/04/2013  T:  04/05/2013  Job:  [id number]

## 2013-04-05 NOTE — Evaluation (Signed)
Physical Therapy Evaluation Patient Details Name: Stacy McclintockSusan B Farrell MRN: [id number] DOB: 01/25/1951 Today's Date: 04/05/2013 Time: [id number] PT Time Calculation (min): 32 min  PT Assessment / Plan / Recommendation History of Present Illness  pt was admitted for L5-S1 decompression and 2 level formainotomies  Clinical Impression  One time eval completed; husband present and intermittently assisting, feels comfortable with level of assist pt will need at home    PT Assessment  Patent does not need any further PT services    Follow Up Recommendations  No PT follow up    Does the patient have the potential to tolerate intense rehabilitation      Barriers to Discharge        Equipment Recommendations  None recommended by PT    Recommendations for Other Services     Frequency      Precautions / Restrictions Precautions Precautions: Back Restrictions Weight Bearing Restrictions: No   Pertinent Vitals/Pain Min c/o pain, " just sore"      Mobility  Bed Mobility Overal bed mobility: Needs Assistance Bed Mobility: Rolling;Sidelying to Sit;Sit to Sidelying Rolling: Min assist;Mod assist Sidelying to sit: Mod assist Sit to sidelying: Min assist General bed mobility comments: assist for legs and trunk getting OOB; legs for back to bed Transfers Overall transfer level: Needs assistance Equipment used: Rolling walker (2 wheeled) Transfers: Sit to/from Stand Sit to Stand: Min assist General transfer comment: assist to rise and steady; cues for back precautions and hand placment Ambulation/Gait Ambulation/Gait assistance: Min guard Ambulation Distance (Feet): 80 Feet Assistive device: Rolling walker (2 wheeled) Gait Pattern/deviations: Step-to pattern Gait velocity: decr General Gait Details: cues for RW position and posture Stairs: Yes Stairs assistance: Min assist Stair Management: One rail Right;Forwards;With cane General stair comments: cues for technique    Exercises      PT Diagnosis:    PT Problem List:   PT Treatment Interventions:       PT Goals(Current goals can be found in the care plan section) Acute Rehab PT Goals PT Goal Formulation: No goals set, d/c therapy  Visit Information  Last PT Received On: 04/05/13 Assistance Needed: +1 History of Present Illness: pt was admitted for L5-S1 decompression and 2 level formainotomies       Prior Functioning  Home Living Family/patient expects to be discharged to:: Private residence Living Arrangements: Spouse/significant other Available Help at Discharge: Family Home Access: Stairs to enter Secretary/administratorntrance Stairs-Number of Steps: 3 Entrance Stairs-Rails: Right Home Equipment: Environmental consultantWalker - 2 wheels;Cane - single point;Bedside commode;Shower seat Prior Function Level of Independence: Independent with assistive device(s) Communication Communication: No difficulties    Cognition  Cognition Arousal/Alertness: Awake/alert Behavior During Therapy: WFL for tasks assessed/performed Overall Cognitive Status: Within Functional Limits for tasks assessed    Extremity/Trunk Assessment Upper Extremity Assessment Upper Extremity Assessment: Defer to OT evaluation Lower Extremity Assessment Lower Extremity Assessment: LLE deficits/detail LLE Deficits / Details: L ankle weak before surgery; at least 2+/5 at time of eval left df; knee grossly WFL   Balance    End of Session PT - End of Session Activity Tolerance: Patient tolerated treatment well Patient left: in bed;with call bell/phone within reach;with family/visitor present Nurse Communication: Mobility status  GP     [facility] HospitalWILLIAMS,Snow Farrell 04/05/2013, 11:32 AM

## 2013-04-05 NOTE — Progress Notes (Signed)
   Subjective: 1 Day Post-Op Procedure(s) (LRB): HEMI-  LAMINECTOMY MICRODISCECTOMY OF L5 -S1 ON LEFT   (Left) Patient reports pain as mild.   Patient seen in rounds without Dr. Darrelyn HillockGioffre. Patient is well, and has had no acute complaints or problems. She reports that she is doing well this morning. Voiding well. Positive flatus.  Plan is to go Home after hospital stay.  Objective: Vital signs in last 24 hours: Temp:  [97.5 F (36.4 C)-98.7 F (37.1 C)] 98.7 F (37.1 C) (02/20 0610) Pulse Rate:  [87-138] 87 (02/20 0610) Resp:  [9-18] 18 (02/20 0610) BP: (114-165)/(68-99) 128/68 mmHg (02/20 0610) SpO2:  [96 %-100 %] 97 % (02/20 0610) Weight:  [126.001 kg (277 lb 12.5 oz)] 126.001 kg (277 lb 12.5 oz) (02/19 1317)  Intake/Output from previous day:  Intake/Output Summary (Last 24 hours) at 04/05/13 0806 Last data filed at 04/05/13 0200  Gross per 24 hour  Intake   3675 ml  Output      1 ml  Net   3674 ml     EXAM General - Patient is Alert and Oriented Extremity - Neurologically intact Intact pulses distally Dorsiflexion/Plantar flexion intact Dressing/Incision - clean, dry Motor Function - intact, moving foot and toes well on exam.   Past Medical History  Diagnosis Date  . Hypertension   . Arthritis   . Lumbar disc herniation     Assessment/Plan: 1 Day Post-Op Procedure(s) (LRB): HEMI-  LAMINECTOMY MICRODISCECTOMY OF L5 -S1 ON LEFT   (Left) Active Problems:   Spinal stenosis, lumbar region, with neurogenic claudication   Morbid obesity  Estimated body mass index is 45.51 kg/(m^2) as calculated from the following:   Height as of this encounter: 5' 5.5" (1.664 m).   Weight as of this encounter: 126.001 kg (277 lb 12.5 oz). Advance diet Up with therapy D/C IV fluids Discharge home   DVT Prophylaxis - Aspirin Weight-Bearing as tolerated   Will walk with PT today. Plan for DC home this afternoon. Discharge instructions given.   Adelbert Gaspard  LAUREN 04/05/2013, 8:06 AM

## 2013-04-05 NOTE — Plan of Care (Signed)
Problem: Consults Goal: Diagnosis - Spinal Surgery Outcome: Completed/Met Date Met:  04/05/13 Lumbar Laminectomy (Complex) L-5 to S-1

## 2013-04-08 NOTE — Discharge Summary (Signed)
Physician Discharge Summary   Patient ID: [name] MRN: [id number] DOB/AGE: April 03, 1950 63 y.o.  Admit date: 04/04/2013 Discharge date: 04/05/2013  Primary Diagnosis: Lumbar spinal stenosis   Admission Diagnoses:  Past Medical History  Diagnosis Date  . Hypertension   . Arthritis   . Lumbar disc herniation    Discharge Diagnoses:   Active Problems:   Spinal stenosis, lumbar region, with neurogenic claudication   Morbid obesity  Estimated body mass index is 45.51 kg/(m^2) as calculated from the following:   Height as of this encounter: 5' 5.5" (1.664 m).   Weight as of this encounter: 126.001 kg (277 lb 12.5 oz).  Procedure:  Procedure(s) (LRB): HEMI-  LAMINECTOMY MICRODISCECTOMY OF L5 -S1 ON LEFT   (Left)   Consults: None  HPI: The patient is a 63 year old female who presented with the chief complaint of low back pain. She has been having progressively worsening pain that radiates into her left LE for 6 months. She denies numbness and tingling as well as change in bladder or bowel function. 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     Laboratory Data: Hospital Outpatient Visit on 03/28/2013  Component Date Value Ref Range Status  . Sodium 03/28/2013 135* 137 - 147 mEq/L Final  . Potassium 03/28/2013 4.0  3.7 - 5.3 mEq/L Final  . Chloride 03/28/2013 96  96 - 112 mEq/L Final  . CO2 03/28/2013 23  19 - 32 mEq/L Final  . Glucose, Bld 03/28/2013 101* 70 - 99 mg/dL Final  . BUN 03/28/2013 10  6 - 23 mg/dL Final  . Creatinine, Ser 03/28/2013 0.69  0.50 - 1.10 mg/dL Final  . Calcium 03/28/2013 9.4  8.4 - 10.5 mg/dL Final  . Total Protein 03/28/2013 7.4  6.0 - 8.3 g/dL Final  . Albumin 03/28/2013 4.2  3.5 - 5.2 g/dL Final  . AST 03/28/2013 24  0 - 37 U/L Final  . ALT 03/28/2013 24  0 - 35 U/L Final  . Alkaline Phosphatase 03/28/2013 74  39 - 117 U/L Final  . Total Bilirubin 03/28/2013 0.6  0.3 - 1.2 mg/dL Final  . GFR calc non Af Amer 03/28/2013 >90   >90 mL/min Final  . GFR calc Af Amer 03/28/2013 >90  >90 mL/min Final   Comment: (NOTE)                          The eGFR has been calculated using the CKD EPI equation.                          This calculation has not been validated in all clinical situations.                          eGFR's persistently <90 mL/min signify possible Chronic Kidney                          Disease.  Marland Kitchen Prothrombin Time 03/28/2013 13.1  11.6 - 15.2 seconds Final  . INR 03/28/2013 1.01  0.00 - 1.49 Final  . Color, Urine 03/28/2013 YELLOW  YELLOW Final  . APPearance 03/28/2013 CLEAR  CLEAR Final  . Specific Gravity, Urine 03/28/2013 1.006  1.005 - 1.030 Final  . pH 03/28/2013 7.0  5.0 - 8.0 Final  . Glucose, UA 03/28/2013 NEGATIVE  NEGATIVE mg/dL Final  .  Hgb urine dipstick 03/28/2013 NEGATIVE  NEGATIVE Final  . Bilirubin Urine 03/28/2013 NEGATIVE  NEGATIVE Final  . Ketones, ur 03/28/2013 NEGATIVE  NEGATIVE mg/dL Final  . Protein, ur 03/28/2013 NEGATIVE  NEGATIVE mg/dL Final  . Urobilinogen, UA 03/28/2013 0.2  0.0 - 1.0 mg/dL Final  . Nitrite 03/28/2013 NEGATIVE  NEGATIVE Final  . Leukocytes, UA 03/28/2013 NEGATIVE  NEGATIVE Final   MICROSCOPIC NOT DONE ON URINES WITH NEGATIVE PROTEIN, BLOOD, LEUKOCYTES, NITRITE, OR GLUCOSE <1000 mg/dL.  Marland Kitchen MRSA, PCR 03/28/2013 NEGATIVE  NEGATIVE Final  . Staphylococcus aureus 03/28/2013 NEGATIVE  NEGATIVE Final   Comment:                                 The Xpert SA Assay (FDA                          approved for NASAL specimens                          in patients over 28 years of age),                          is one component of                          a comprehensive surveillance                          program.  Test performance has                          been validated by [employer] for patients greater                          than or equal to 71 year old.                          It is not intended                          to  diagnose infection nor to                          guide or monitor treatment.  Marland Kitchen aPTT 03/28/2013 34  24 - 37 seconds Final  . WBC 03/28/2013 8.4  4.0 - 10.5 K/uL Final  . RBC 03/28/2013 5.49* 3.87 - 5.11 MIL/uL Final  . Hemoglobin 03/28/2013 16.6* 12.0 - 15.0 g/dL Final  . HCT 03/28/2013 46.7* 36.0 - 46.0 % Final  . MCV 03/28/2013 85.1  78.0 - 100.0 fL Final  . MCH 03/28/2013 30.2  26.0 - 34.0 pg Final  . MCHC 03/28/2013 35.5  30.0 - 36.0 g/dL Final  . RDW 03/28/2013 13.2  11.5 - 15.5 % Final  . Platelets 03/28/2013 198  150 - 400 K/uL Final     X-Rays:Dg Chest 2 View  03/28/2013   CLINICAL DATA:  Hypertension.  EXAM: CHEST  2 VIEW  COMPARISON:  None.  FINDINGS: The heart size and mediastinal contours are within normal limits. Both lungs are clear. The visualized skeletal structures are unremarkable.  IMPRESSION: No active cardiopulmonary disease.   Electronically Signed   By: Roque Lias M.D.   On: 03/28/2013 11:45   Dg Lumbar Spine 2-3 Views  03/28/2013   CLINICAL DATA:  Lower back pain.  EXAM: LUMBAR SPINE - 2-3 VIEW  COMPARISON:  September 10, 2008.  FINDINGS: No fracture is noted. Minimal retrolisthesis of L2-3 is noted which is unchanged compared to prior exam. Degenerative disc disease is noted at L1-2, L2-3, L3-4 and L5-S1. Hypertrophy of posterior facet joints is noted at L3-4, L4-5 and L5-S1 secondary to degenerative joint disease. Incidental note is made of severe degenerative joint disease of left hip.  IMPRESSION: Multilevel degenerative disc disease which is stable compared to prior exam. No acute abnormality seen in the lumbar spine.   Electronically Signed   By: Roque Lias M.D.   On: 03/28/2013 11:47   Dg Spine Portable 1 View  04/04/2013   CLINICAL DATA:  L5-S1 surgical level  EXAM: PORTABLE SPINE - 1 VIEW  COMPARISON:  Portable cross-table lateral intraoperative view labeled #3 at 1405 hr compared to 1322 hr  FINDINGS: Prior exam is labeled with 5 lumbar vertebrael, current exam  labeled accordingly.  Two metallic probes via dorsal approach are identified.  The cranial most probe projects dorsal to the L5-S1 disc space.  The caudal most probe projects dorsal to the S1 segment of the sacrum.  Tissue spreaders are present the L5 and S1 levels dorsally.  Disc space narrowing L5-S1.  IMPRESSION: Dorsal localization of the L5-S1 disc space and S1 levels as above.   Electronically Signed   By: Ulyses Southward M.D.   On: 04/04/2013 14:23   Dg Spine Portable 1 View  04/04/2013   CLINICAL DATA:  L5-S1 surgical level  EXAM: PORTABLE SPINE - 1 VIEW  COMPARISON:  Portable cross-table lateral view of the lumbar spine at 1322 hr compared to the earlier study of 1307 hr  FINDINGS: Prior exam labeled with 5 lumbar vertebrae, current exam labeled similarly.  2 metallic probes via dorsal approach are identified with tips projecting dorsal to the L5-S1 disc space.  Tissues better projects dorsal to the sacrum.  IMPRESSION: Posterior localization of the L5-S1 disc space level.   Electronically Signed   By: Ulyses Southward M.D.   On: 04/04/2013 14:19   Dg Spine Portable 1 View  04/04/2013   CLINICAL DATA:  Lumbar surgery.  EXAM: PORTABLE SPINE - 1 VIEW  COMPARISON:  DG LUMBAR SPINE 2-3 VIEWS dated 03/28/2013  FINDINGS: Lateral view of the cervical spine reveals of markers at the L5 and S1 levels. Diffuse degenerative change. No acute bony abnormality.  IMPRESSION: Surgical markers at the L5 and S1 levels.   Electronically Signed   By: Maisie Fus  Register   On: 04/04/2013 13:42    EKG: Orders placed during the hospital encounter of 03/28/13  . EKG 12-LEAD  . EKG 12-LEAD  . EKG     Hospital Course: [name] is a 63 y.o. who was admitted to [facility]. They were brought to the operating room on 04/04/2013 and underwent Procedure(s): HEMI-  LAMINECTOMY MICRODISCECTOMY OF L5 -S1 ON LEFT  .  Patient tolerated the procedure well and was later transferred to the recovery room and then to the  orthopaedic floor for postoperative care.  They were given PO and IV analgesics for  pain control following their surgery.  They were given 24 hours of postoperative antibiotics of  Anti-infectives   Start     Dose/Rate Route Frequency Ordered Stop   04/04/13 2000  ceFAZolin (ANCEF) IVPB 1 g/50 mL premix     1 g 100 mL/hr over 30 Minutes Intravenous Every 8 hours 04/04/13 1626 04/05/13 1204   04/04/13 1313  polymyxin B 500,000 Units, bacitracin 50,000 Units in sodium chloride irrigation 0.9 % 500 mL irrigation  Status:  Discontinued       As needed 04/04/13 1313 04/04/13 1450   04/04/13 1305  ceFAZolin (ANCEF) IVPB 1 g/50 mL premix     1 g 100 mL/hr over 30 Minutes Intravenous 60 min pre-op 04/04/13 1307 04/04/13 1403   04/04/13 0930  ceFAZolin (ANCEF) IVPB 2 g/50 mL premix     2 g 100 mL/hr over 30 Minutes Intravenous On call to O.R. 04/04/13 0930 04/04/13 1238     and started on DVT prophylaxis in the form of Aspirin.   PT was ordered for gait training and assistance.  Discharge planning consulted to help with postop disposition and equipment needs.  Patient had a fair night on the evening of surgery.  They started to get up OOB with therapy on day one. Dressing was changed on day two and the incision was clean and dry. Patient was seen in rounds and was ready to go home.   Discharge Medications: Prior to Admission medications   Medication Sig Start Date End Date Taking? Authorizing Provider  lisinopril-hydrochlorothiazide (PRINZIDE,ZESTORETIC) 10-12.5 MG per tablet Take 1 tablet by mouth every morning.   Yes Historical Provider, MD  methocarbamol (ROBAXIN) 500 MG tablet Take 1 tablet (500 mg total) by mouth every 6 (six) hours as needed for muscle spasms. 04/05/13   Cyntia Staley Renelda Loma, PA-C  oxyCODONE-acetaminophen (PERCOCET/ROXICET) 5-325 MG per tablet Take 1-2 tablets by mouth every 4 (four) hours as needed for severe pain (Q4-6 hours PRN). 04/05/13   Mabrey Howland Renelda Loma, PA-C     Diet: Regular diet Activity:WBAT Follow-up:in 2 weeks Disposition - Home Discharged Condition: stable   Discharge Orders   Future Orders Complete By Expires   Call MD / Call 911  As directed    Comments:     If you experience chest pain or shortness of breath, CALL 911 and be transported to the hospital emergency room.  If you develope a fever above 101 F, pus (white drainage) or increased drainage or redness at the wound, or calf pain, call your surgeon's office.   Constipation Prevention  As directed    Comments:     Drink plenty of fluids.  Prune juice may be helpful.  You may use a stool softener, such as Colace (over the counter) 100 mg twice a day.  Use MiraLax (over the counter) for constipation as needed.   Diet - low sodium heart healthy  As directed    Discharge instructions  As directed    Comments:     Change your dressing daily. Shower only, no tub bath. May shower starting Saturday. Saturday-Monday remove dressing prior to showering and place saran wrap over incision. Remove saran wrap and place new dressing over incision after shower.  Call if any temperatures greater than 101 or any wound complications: [phone] during the day and ask for Dr. Charlestine Night nurse, Brunilda Payor.   Driving restrictions  As directed    Comments:     No driving   Increase activity slowly as tolerated  As directed        Medication List    STOP taking these medications       HYDROcodone-acetaminophen 5-325 MG per tablet  Commonly known as:  NORCO/VICODIN      TAKE these medications       lisinopril-hydrochlorothiazide 10-12.5 MG per tablet  Commonly known as:  PRINZIDE,ZESTORETIC  Take 1 tablet by mouth every morning.     methocarbamol 500 MG tablet  Commonly known as:  ROBAXIN  Take 1 tablet (500 mg total) by mouth every 6 (six) hours as needed for muscle spasms.     oxyCODONE-acetaminophen 5-325 MG per tablet  Commonly known as:  PERCOCET/ROXICET  Take 1-2 tablets by  mouth every 4 (four) hours as needed for severe pain (Q4-6 hours PRN).           Follow-up Information   Follow up with GIOFFRE,RONALD A, MD. Schedule an appointment as soon as possible for a visit in 2 weeks.   Specialty:  Orthopedic Surgery   Contact information:   [address] [place] [address] [phone]       Signed: Ardeen Jourdain [facility] 04/08/2013, 8:52 AM

## 2013-09-10 ENCOUNTER — Encounter (HOSPITAL_COMMUNITY): Payer: Self-pay | Admitting: Pharmacy Technician

## 2013-09-11 ENCOUNTER — Encounter (HOSPITAL_COMMUNITY)
Admission: RE | Admit: 2013-09-11 | Discharge: 2013-09-11 | Disposition: A | Discharge status: Home / Self Care | Payer: BC Managed Care – PPO | Source: Ambulatory Visit | Attending: Orthopedic Surgery | Admitting: Orthopedic Surgery

## 2013-09-11 ENCOUNTER — Encounter (HOSPITAL_COMMUNITY): Payer: Self-pay

## 2013-09-11 DIAGNOSIS — Z01818 Encounter for other preprocedural examination: Secondary | ICD-10-CM | POA: Insufficient documentation

## 2013-09-11 DIAGNOSIS — Z01812 Encounter for preprocedural laboratory examination: Secondary | ICD-10-CM | POA: Insufficient documentation

## 2013-09-11 LAB — BASIC METABOLIC PANEL
ANION GAP: 15 (ref 5–15)
BUN: 11 mg/dL (ref 6–23)
CALCIUM: 9.8 mg/dL (ref 8.4–10.5)
CO2: 25 mEq/L (ref 19–32)
Chloride: 98 mEq/L (ref 96–112)
Creatinine, Ser: 0.66 mg/dL (ref 0.50–1.10)
GFR calc Af Amer: >90 mL/min (ref >90)
GFR calc non Af Amer: >90 mL/min (ref >90)
Glucose, Bld: 97 mg/dL (ref 70–99)
Potassium: 4.2 mEq/L (ref 3.7–5.3)
SODIUM: 138 meq/L (ref 137–147)

## 2013-09-11 LAB — URINALYSIS, ROUTINE W REFLEX MICROSCOPIC
BILIRUBIN URINE: NEGATIVE
GLUCOSE, UA: NEGATIVE mg/dL
Hgb urine dipstick: NEGATIVE
KETONES UR: NEGATIVE mg/dL
Leukocytes, UA: NEGATIVE
Nitrite: NEGATIVE
Protein, ur: NEGATIVE mg/dL
Specific Gravity, Urine: 1.008 (ref 1.005–1.030)
Urobilinogen, UA: 1 mg/dL (ref 0.0–1.0)
pH: 7.5 (ref 5.0–8.0)

## 2013-09-11 LAB — CBC
HCT: 44 % (ref 36.0–46.0)
Hemoglobin: 15 g/dL (ref 12.0–15.0)
MCH: 28.8 pg (ref 26.0–34.0)
MCHC: 34.1 g/dL (ref 30.0–36.0)
MCV: 84.6 fL (ref 78.0–100.0)
Platelets: 197 10*3/uL (ref 150–400)
RBC: 5.2 MIL/uL — ABNORMAL HIGH (ref 3.87–5.11)
RDW: 13.7 % (ref 11.5–15.5)
WBC: 10.1 10*3/uL (ref 4.0–10.5)

## 2013-09-11 LAB — SURGICAL PCR SCREEN
MRSA, PCR: NEGATIVE
STAPHYLOCOCCUS AUREUS: NEGATIVE

## 2013-09-11 LAB — PROTIME-INR
INR: 1.02 (ref 0.00–1.49)
Prothrombin Time: 13.4 seconds (ref 11.6–15.2)

## 2013-09-11 LAB — APTT: APTT: 36 s (ref 24–37)

## 2013-09-11 NOTE — H&P (Signed)
TOTAL HIP ADMISSION H&P  Patient is admitted for left total hip arthroplasty, anterior approach .  Subjective:  Chief Complaint:    Left hip OA / pain  HPI: [name], 63 y.o. female, has a history of pain and functional disability in the left hip(s) due to arthritis and patient has failed non-surgical conservative treatments for greater than 12 weeks to include NSAID's and/or analgesics, use of assistive devices and activity modification.  Onset of symptoms was gradual starting 1+ years ago with gradually worsening course since that time.The patient noted prior procedures of the hip to include arthroplasty on the right hip(s).  Patient currently rates pain in the left hip at 10 out of 10 with activity. Patient has night pain, worsening of pain with activity and weight bearing, trendelenberg gait, pain that interfers with activities of daily living and pain with passive range of motion. Patient has evidence of periarticular osteophytes and joint space narrowing by imaging studies. This condition presents safety issues increasing the risk of falls. There is no current active infection.  Risks, benefits and expectations were discussed with the patient.  Risks including but not limited to the risk of anesthesia, blood clots, nerve damage, blood vessel damage, failure of the prosthesis, infection and up to and including death.  Patient understand the risks, benefits and expectations and wishes to proceed with surgery.   PCP: Trinna PostKOBERLEIN, JUNELL CAROL, MD  D/C Plans:      Home with HHPT  Post-op Meds:       No Rx given   Tranexamic Acid:      To be given - IV    Decadron:      Is to be given  FYI:     ASA post-op  Norco post-op    Patient Active Problem List   Diagnosis Date Noted  . Spinal stenosis, lumbar region, with neurogenic claudication 04/04/2013  . Morbid obesity 04/04/2013   Past Medical History  Diagnosis Date  . Hypertension   . Arthritis   . Lumbar disc herniation    hx. of. surgery 2'15    Past Surgical History  Procedure Laterality Date  . Abdominal hysterectomy    . Joint replacement Right 2012    knee  . Joint replacement Right 2010    hip  . Hemi-microdiscectomy lumbar laminectomy level 1 Left 04/04/2013    Procedure: HEMI-  LAMINECTOMY MICRODISCECTOMY OF L5 -S1 ON LEFT  ;  Surgeon: Jacki Conesonald A Gioffre, MD;  Location: [facility] ORS;  Service: Orthopedics;  Laterality: Left;    No prescriptions prior to admission   No Known Allergies   History  Substance Use Topics  . Smoking status: Never Smoker   . Smokeless tobacco: Never Used  . Alcohol Use: No       Review of Systems  Constitutional: Negative.   HENT: Negative.   Eyes: Negative.   Respiratory: Negative.   Cardiovascular: Negative.   Gastrointestinal: Negative.   Genitourinary: Negative.   Musculoskeletal: Positive for back pain and joint pain.  Skin: Negative.   Neurological: Negative.   Endo/Heme/Allergies: Negative.   Psychiatric/Behavioral: Negative.     Objective:  Physical Exam  Constitutional: She is oriented to person, place, and time. She appears well-developed and well-nourished.  HENT:  Head: Normocephalic and atraumatic.  Eyes: Pupils are equal, round, and reactive to light.  Neck: Neck supple. No JVD present. No tracheal deviation present. No thyromegaly present.  Cardiovascular: Normal rate, regular rhythm, normal heart sounds and intact distal pulses.  Respiratory: Effort normal and breath sounds normal. No stridor. No respiratory distress. She has no wheezes.  GI: Soft. There is no tenderness. There is no guarding.  Musculoskeletal:       Left hip: She exhibits decreased range of motion, decreased strength, tenderness and bony tenderness. She exhibits no swelling, no deformity and no laceration.  Lymphadenopathy:    She has no cervical adenopathy.  Neurological: She is alert and oriented to person, place, and time.  Skin: Skin is warm and dry.  Psychiatric:  She has a normal mood and affect.    Vital signs in last 24 hours: Temp:  [97.9 F (36.6 C)] 97.9 F (36.6 C) (07/29 0911) Pulse Rate:  [84] 84 (07/29 0911) Resp:  [18] 18 (07/29 0911) BP: (155)/(73) 155/73 mmHg (07/29 0911) SpO2:  [97 %] 97 % (07/29 0911) Weight:  [108.977 kg (240 lb 4 oz)] 108.977 kg (240 lb 4 oz) (07/29 0911)  Labs:   Estimated body mass index is 20.64 kg/(m^2) as calculated from the following:   Height as of 04/04/13: 5' 5.5" (1.664 m).   Weight as of 03/28/13: 57.153 kg (126 lb).   Imaging Review Plain radiographs demonstrate severe degenerative joint disease of the left hip(s). The bone quality appears to be good for age and reported activity level.  Assessment/Plan:  End stage arthritis, left hip(s)  The patient history, physical examination, clinical judgement of the provider and imaging studies are consistent with end stage degenerative joint disease of the left hip(s) and total hip arthroplasty is deemed medically necessary. The treatment options including medical management, injection therapy, arthroscopy and arthroplasty were discussed at length. The risks and benefits of total hip arthroplasty were presented and reviewed. The risks due to aseptic loosening, infection, stiffness, dislocation/subluxation,  thromboembolic complications and other imponderables were discussed.  The patient acknowledged the explanation, agreed to proceed with the plan and consent was signed. Patient is being admitted for inpatient treatment for surgery, pain control, PT, OT, prophylactic antibiotics, VTE prophylaxis, progressive ambulation and ADL's and discharge planning.The patient is planning to be discharged home with home health services.    Anastasio Auerbach Seidy Labreck   PA-C  09/11/2013, 5:50 PM

## 2013-09-11 NOTE — Patient Instructions (Addendum)
20 Bo McclintockSusan B Schnake  09/11/2013   Your procedure is scheduled on:  8-11 -2015 Tuesday at 1130 AM.  [facility] and follow signs to Short Stay Center. Arrive at    0830    AM.  Call this number if you have problems the morning of surgery: [phone]  Or Presurgical Testing [phone]([name]) For Living Will and/or Health Care Power Attorney Forms: please provide copy for your medical record,may bring AM of surgery(Forms should be already notarized -we do not provide this service).(09-11-13 -No information preferred today).     Do not eat food:After Midnight.      Take these medicines the morning of surgery with A SIP OF WATER: Hydrocodone   Do not wear jewelry, make-up or nail polish.  Do not wear lotions, powders, or perfumes. You may wear deodorant.  Do not shave 48 hours(2 days) prior to first CHG shower(legs and under arms).(Shaving face and neck okay.)  Do not bring valuables to the [facility].([facility] is not responsible for lost valuables).  Contacts, dentures or removable bridgework, body piercing, hair pins may not be worn into surgery.  Leave suitcase in the car. After surgery it may be brought to your room.  For patients admitted to the [facility], checkout time is 11:00 AM the day of discharge.(Restricted visitors-Any Persons displaying flu-like symptoms or illness).    Patients discharged the day of surgery will not be allowed to drive home. Must have responsible person with you x 24 hours once discharged.  Name and phone number of your driver: [name]- spouse [phone]  Special Instructions: CHG(Chlorhedine 4%-"Hibiclens","Betasept","Aplicare") Shower Use Special Wash: see special instructions.(avoid face and genitals)   Please read over the following fact sheets that you were given: MRSA Information, Blood Transfusion fact sheet, Incentive Spirometry Instruction.  Remember : Type/Screen "Blue armbands" - may not be removed once  applied(would result in being retested AM of surgery, if removed).     ________________________    [facility] Pre[facility] Before surgery, you can play an important role.  Because skin is not sterile, your skin needs to be as free of germs as possible.  You can reduce the number of germs on your skin by washing with CHG (chlorahexidine gluconate) soap before surgery.  CHG is an antiseptic cleaner which kills germs and bonds with the skin to continue killing germs even after washing. Please DO NOT use if you have an allergy to CHG or antibacterial soaps.  If your skin becomes reddened/irritated stop using the CHG and inform your nurse when you arrive at Short Stay. Do not shave (including legs and underarms) for at least 48 hours prior to the first CHG shower.  You may shave your face/neck. Please follow these instructions carefully:  1.  Shower with CHG Soap the night before surgery and the  morning of Surgery.  2.  If you choose to wash your hair, wash your hair first as usual with your  normal  shampoo.  3.  After you shampoo, rinse your hair and body thoroughly to remove the  shampoo.                           4.  Use CHG as you would any other liquid soap.  You can apply chg directly  to the skin and wash  Gently with a scrungie or clean washcloth.  5.  Apply the CHG Soap to your body ONLY FROM THE NECK DOWN.   Do not use on face/ open                           Wound or open sores. Avoid contact with eyes, ears mouth and genitals (private parts).                       Wash face,  Genitals (private parts) with your normal soap.             6.  Wash thoroughly, paying special attention to the area where your surgery  will be performed.  7.  Thoroughly rinse your body with warm water from the neck down.  8.  DO NOT shower/wash with your normal soap after using and rinsing off  the CHG Soap.                9.  Pat yourself dry with a clean towel.            10.   Wear clean pajamas.            11.  Place clean sheets on your bed the night of your first shower and do not  sleep with pets. Day of Surgery : Do not apply any lotions/deodorants the morning of surgery.  Please wear clean clothes to the [facility]/surgery center.  FAILURE TO FOLLOW THESE INSTRUCTIONS MAY RESULT IN THE CANCELLATION OF YOUR SURGERY PATIENT SIGNATURE_________________________________  NURSE SIGNATURE__________________________________  ________________________________________________________________________   [name]  An incentive spirometer is a tool that can help keep your lungs clear and active. This tool measures how well you are filling your lungs with each breath. Taking long deep breaths may help reverse or decrease the chance of developing breathing (pulmonary) problems (especially infection) following:  A long period of time when you are unable to move or be active. BEFORE THE PROCEDURE   If the spirometer includes an indicator to show your best effort, your nurse or respiratory therapist will set it to a desired goal.  If possible, sit up straight or lean slightly forward. Try not to slouch.  Hold the incentive spirometer in an upright position. INSTRUCTIONS FOR USE  1. Sit on the edge of your bed if possible, or sit up as far as you can in bed or on a chair. 2. Hold the incentive spirometer in an upright position. 3. Breathe out normally. 4. Place the mouthpiece in your mouth and seal your lips tightly around it. 5. Breathe in slowly and as deeply as possible, raising the piston or the ball toward the top of the column. 6. Hold your breath for 3-5 seconds or for as long as possible. Allow the piston or ball to fall to the bottom of the column. 7. Remove the mouthpiece from your mouth and breathe out normally. 8. Rest for a few seconds and repeat Steps 1 through 7 at least 10 times every 1-2 hours when you are awake. Take your time and take a few  normal breaths between deep breaths. 9. The spirometer may include an indicator to show your best effort. Use the indicator as a goal to work toward during each repetition. 10. After each set of 10 deep breaths, practice coughing to be sure your lungs are clear. If you have an incision (the cut made at the time of  surgery), support your incision when coughing by placing a pillow or rolled up towels firmly against it. Once you are able to get out of bed, walk around indoors and cough well. You may stop using the incentive spirometer when instructed by your caregiver.  RISKS AND COMPLICATIONS  Take your time so you do not get dizzy or light-headed.  If you are in pain, you may need to take or ask for pain medication before doing incentive spirometry. It is harder to take a deep breath if you are having pain. AFTER USE  Rest and breathe slowly and easily.  It can be helpful to keep track of a log of your progress. Your caregiver can provide you with a simple table to help with this. If you are using the spirometer at home, follow these instructions: [place] IF:   You are having difficultly using the spirometer.  You have trouble using the spirometer as often as instructed.  Your pain medication is not giving enough relief while using the spirometer.  You develop fever of 100.5 F (38.1 C) or higher. SEEK IMMEDIATE MEDICAL CARE IF:   You cough up bloody sputum that had not been present before.  You develop fever of 102 F (38.9 C) or greater.  You develop worsening pain at or near the incision site. MAKE SURE YOU:   Understand these instructions.  Will watch your condition.  Will get help right away if you are not doing well or get worse. Document Released: 06/13/2006 Document Revised: 04/25/2011 Document Reviewed: 08/14/2006 ExitCare Patient Information 2014 ExitCare, [place].   ________________________________________________________________________  WHAT IS A  BLOOD TRANSFUSION? Blood Transfusion Information  A transfusion is the replacement of blood or some of its parts. Blood is made up of multiple cells which provide different functions.  Red blood cells carry oxygen and are used for blood loss replacement.  White blood cells fight against infection.  Platelets control bleeding.  Plasma helps clot blood.  Other blood products are available for specialized needs, such as hemophilia or other clotting disorders. BEFORE THE TRANSFUSION  Who gives blood for transfusions?   Healthy volunteers who are fully evaluated to make sure their blood is safe. This is blood bank blood. Transfusion therapy is the safest it has ever been in the practice of medicine. Before blood is taken from a donor, a complete history is taken to make sure that person has no history of diseases nor engages in risky social behavior (examples are intravenous drug use or sexual activity with multiple partners). The donor's travel history is screened to minimize risk of transmitting infections, such as malaria. The donated blood is tested for signs of infectious diseases, such as HIV and hepatitis. The blood is then tested to be sure it is compatible with you in order to minimize the chance of a transfusion reaction. If you or a relative donates blood, this is often done in anticipation of surgery and is not appropriate for emergency situations. It takes many days to process the donated blood. RISKS AND COMPLICATIONS Although transfusion therapy is very safe and saves many lives, the main dangers of transfusion include:   Getting an infectious disease.  Developing a transfusion reaction. This is an allergic reaction to something in the blood you were given. Every precaution is taken to prevent this. The decision to have a blood transfusion has been considered carefully by your caregiver before blood is given. Blood is not given unless the benefits outweigh the risks. AFTER THE  TRANSFUSION  Right after receiving a blood transfusion, you will usually feel much better and more energetic. This is especially true if your red blood cells have gotten low (anemic). The transfusion raises the level of the red blood cells which carry oxygen, and this usually causes an energy increase.  The nurse administering the transfusion will monitor you carefully for complications. HOME CARE INSTRUCTIONS  No special instructions are needed after a transfusion. You may find your energy is better. Speak with your caregiver about any limitations on activity for underlying diseases you may have. SEEK MEDICAL CARE IF:   Your condition is not improving after your transfusion.  You develop redness or irritation at the intravenous (IV) site. SEEK IMMEDIATE MEDICAL CARE IF:  Any of the following symptoms occur over the next 12 hours:  Shaking chills.  You have a temperature by mouth above 102 F (38.9 C), not controlled by medicine.  Chest, back, or muscle pain.  People around you feel you are not acting correctly or are confused.  Shortness of breath or difficulty breathing.  Dizziness and fainting.  You get a rash or develop hives.  You have a decrease in urine output.  Your urine turns a dark color or changes to pink, red, or brown. Any of the following symptoms occur over the next 10 days:  You have a temperature by mouth above 102 F (38.9 C), not controlled by medicine.  Shortness of breath.  Weakness after normal activity.  The white part of the eye turns yellow (jaundice).  You have a decrease in the amount of urine or are urinating less often.  Your urine turns a dark color or changes to pink, red, or brown. Document Released: 01/29/2000 Document Revised: 04/25/2011 Document Reviewed: 09/17/2007 [facility] Patient Information 2014 [place], [place].  _______________________________________________________________________

## 2013-09-11 NOTE — Pre-Procedure Instructions (Signed)
09-11-13 EKG/ CXR 2'15 Epic. Clearance note (09-02-13) [facility], [place], [place].

## 2013-09-24 ENCOUNTER — Inpatient Hospital Stay (HOSPITAL_COMMUNITY)
Admission: RE | Admit: 2013-09-24 | Discharge: 2013-09-25 | DRG: 470 | Disposition: A | Discharge status: Home Health | Payer: BC Managed Care – PPO | Source: Ambulatory Visit | Attending: Orthopedic Surgery | Admitting: Orthopedic Surgery

## 2013-09-24 ENCOUNTER — Inpatient Hospital Stay (HOSPITAL_COMMUNITY): Payer: BC Managed Care – PPO

## 2013-09-24 ENCOUNTER — Encounter (HOSPITAL_COMMUNITY): Payer: BC Managed Care – PPO | Admitting: Anesthesiology

## 2013-09-24 ENCOUNTER — Encounter (HOSPITAL_COMMUNITY)
Admission: RE | Disposition: A | Discharge status: Home Health | Payer: Self-pay | Source: Ambulatory Visit | Attending: Orthopedic Surgery

## 2013-09-24 ENCOUNTER — Inpatient Hospital Stay (HOSPITAL_COMMUNITY): Payer: BC Managed Care – PPO | Admitting: Anesthesiology

## 2013-09-24 ENCOUNTER — Encounter (HOSPITAL_COMMUNITY): Payer: Self-pay | Admitting: *Deleted

## 2013-09-24 DIAGNOSIS — I10 Essential (primary) hypertension: Secondary | ICD-10-CM | POA: Diagnosis present

## 2013-09-24 DIAGNOSIS — E669 Obesity, unspecified: Secondary | ICD-10-CM | POA: Diagnosis present

## 2013-09-24 DIAGNOSIS — D62 Acute posthemorrhagic anemia: Secondary | ICD-10-CM | POA: Diagnosis not present

## 2013-09-24 DIAGNOSIS — Z96649 Presence of unspecified artificial hip joint: Secondary | ICD-10-CM

## 2013-09-24 DIAGNOSIS — Z01812 Encounter for preprocedural laboratory examination: Secondary | ICD-10-CM

## 2013-09-24 DIAGNOSIS — M169 Osteoarthritis of hip, unspecified: Principal | ICD-10-CM | POA: Diagnosis present

## 2013-09-24 DIAGNOSIS — Z6839 Body mass index (BMI) 39.0-39.9, adult: Secondary | ICD-10-CM | POA: Diagnosis not present

## 2013-09-24 DIAGNOSIS — Z96642 Presence of left artificial hip joint: Secondary | ICD-10-CM

## 2013-09-24 DIAGNOSIS — M25559 Pain in unspecified hip: Secondary | ICD-10-CM | POA: Diagnosis present

## 2013-09-24 DIAGNOSIS — M1610 Unilateral primary osteoarthritis, unspecified hip: Secondary | ICD-10-CM | POA: Diagnosis present

## 2013-09-24 HISTORY — PX: TOTAL HIP ARTHROPLASTY: SHX124

## 2013-09-24 LAB — TYPE AND SCREEN
ABO/RH(D): A POS
Antibody Screen: NEGATIVE

## 2013-09-24 SURGERY — ARTHROPLASTY, HIP, TOTAL, ANTERIOR APPROACH
Anesthesia: Spinal | Site: Hip | Laterality: Left

## 2013-09-24 MED ORDER — HYDROMORPHONE HCL PF 1 MG/ML IJ SOLN
0.2500 mg | INTRAMUSCULAR | Status: DC | PRN
  Administered 2013-09-24 (×2): 0.5 mg via INTRAVENOUS

## 2013-09-24 MED ORDER — FERROUS SULFATE 325 (65 FE) MG PO TABS
325.0000 mg | ORAL_TABLET | Freq: Three times a day (TID) | ORAL | Status: DC
  Administered 2013-09-24 – 2013-09-25 (×2): 325 mg via ORAL
  Filled 2013-09-24 (×5): qty 1

## 2013-09-24 MED ORDER — KETOROLAC TROMETHAMINE 30 MG/ML IJ SOLN
30.0000 mg | Freq: Once | INTRAMUSCULAR | Status: AC
  Administered 2013-09-24: 30 mg via INTRAVENOUS

## 2013-09-24 MED ORDER — PHENOL 1.4 % MT LIQD
1.0000 | OROMUCOSAL | Status: DC | PRN
  Filled 2013-09-24: qty 177

## 2013-09-24 MED ORDER — MIDAZOLAM HCL 2 MG/2ML IJ SOLN
INTRAMUSCULAR | Status: AC
  Filled 2013-09-24: qty 2

## 2013-09-24 MED ORDER — CEFAZOLIN SODIUM-DEXTROSE 2-3 GM-% IV SOLR
2.0000 g | Freq: Four times a day (QID) | INTRAVENOUS | Status: AC
  Administered 2013-09-24 (×2): 2 g via INTRAVENOUS
  Filled 2013-09-24 (×2): qty 50

## 2013-09-24 MED ORDER — BISACODYL 10 MG RE SUPP: 10.0000 mg | Freq: Every day | RECTAL | Status: DC | PRN

## 2013-09-24 MED ORDER — CEFAZOLIN SODIUM-DEXTROSE 2-3 GM-% IV SOLR
INTRAVENOUS | Status: AC
  Filled 2013-09-24: qty 50

## 2013-09-24 MED ORDER — KETOROLAC TROMETHAMINE 30 MG/ML IJ SOLN
INTRAMUSCULAR | Status: AC
Start: 2013-09-24 — End: 2013-09-25
  Filled 2013-09-24: qty 1

## 2013-09-24 MED ORDER — ALUM & MAG HYDROXIDE-SIMETH 200-200-20 MG/5ML PO SUSP: 30.0000 mL | ORAL | Status: DC | PRN

## 2013-09-24 MED ORDER — FENTANYL CITRATE 0.05 MG/ML IJ SOLN
INTRAMUSCULAR | Status: AC
  Filled 2013-09-24: qty 2

## 2013-09-24 MED ORDER — LACTATED RINGERS IV SOLN
INTRAVENOUS | Status: DC
  Administered 2013-09-24 (×2): via INTRAVENOUS
  Administered 2013-09-24: 1000 mL via INTRAVENOUS

## 2013-09-24 MED ORDER — ACETAMINOPHEN 650 MG RE SUPP: 650.0000 mg | Freq: Four times a day (QID) | RECTAL | Status: DC | PRN

## 2013-09-24 MED ORDER — ONDANSETRON HCL 4 MG/2ML IJ SOLN: 4.0000 mg | Freq: Four times a day (QID) | INTRAMUSCULAR | Status: DC | PRN

## 2013-09-24 MED ORDER — DIPHENHYDRAMINE HCL 25 MG PO CAPS: 25.0000 mg | ORAL_CAPSULE | Freq: Four times a day (QID) | ORAL | Status: DC | PRN

## 2013-09-24 MED ORDER — 0.9 % SODIUM CHLORIDE (POUR BTL) OPTIME
TOPICAL | Status: DC | PRN
  Administered 2013-09-24: 1000 mL

## 2013-09-24 MED ORDER — ACETAMINOPHEN 325 MG PO TABS: 650.0000 mg | ORAL_TABLET | Freq: Four times a day (QID) | ORAL | Status: DC | PRN

## 2013-09-24 MED ORDER — CHLORHEXIDINE GLUCONATE 4 % EX LIQD: 60.0000 mL | Freq: Once | CUTANEOUS | Status: DC

## 2013-09-24 MED ORDER — DEXAMETHASONE SODIUM PHOSPHATE 10 MG/ML IJ SOLN: 10.0000 mg | Freq: Once | INTRAMUSCULAR | Status: DC

## 2013-09-24 MED ORDER — DEXAMETHASONE SODIUM PHOSPHATE 10 MG/ML IJ SOLN
INTRAMUSCULAR | Status: DC | PRN
  Administered 2013-09-24: 10 mg via INTRAVENOUS

## 2013-09-24 MED ORDER — ONDANSETRON HCL 4 MG PO TABS: 4.0000 mg | ORAL_TABLET | Freq: Four times a day (QID) | ORAL | Status: DC | PRN

## 2013-09-24 MED ORDER — POLYETHYLENE GLYCOL 3350 17 G PO PACK
17.0000 g | PACK | Freq: Every day | ORAL | Status: DC | PRN
Start: 2013-09-24 — End: 2013-09-25

## 2013-09-24 MED ORDER — LIDOCAINE HCL (CARDIAC) 20 MG/ML IV SOLN
INTRAVENOUS | Status: AC
  Filled 2013-09-24: qty 5

## 2013-09-24 MED ORDER — METOCLOPRAMIDE HCL 5 MG/ML IJ SOLN: 5.0000 mg | Freq: Three times a day (TID) | INTRAMUSCULAR | Status: DC | PRN

## 2013-09-24 MED ORDER — HYDROMORPHONE HCL PF 1 MG/ML IJ SOLN
INTRAMUSCULAR | Status: AC
  Filled 2013-09-24: qty 1

## 2013-09-24 MED ORDER — HYDROMORPHONE HCL PF 1 MG/ML IJ SOLN
0.5000 mg | INTRAMUSCULAR | Status: DC | PRN
  Administered 2013-09-24: 0.5 mg via INTRAVENOUS
  Filled 2013-09-24: qty 1

## 2013-09-24 MED ORDER — METOCLOPRAMIDE HCL 10 MG PO TABS: 5.0000 mg | ORAL_TABLET | Freq: Three times a day (TID) | ORAL | Status: DC | PRN

## 2013-09-24 MED ORDER — HYDROCODONE-ACETAMINOPHEN 7.5-325 MG PO TABS
1.0000 | ORAL_TABLET | ORAL | Status: DC | PRN
  Administered 2013-09-24 – 2013-09-25 (×4): 2 via ORAL
  Filled 2013-09-24 (×4): qty 2

## 2013-09-24 MED ORDER — CELECOXIB 200 MG PO CAPS
200.0000 mg | ORAL_CAPSULE | Freq: Two times a day (BID) | ORAL | Status: DC
  Administered 2013-09-24 – 2013-09-25 (×2): 200 mg via ORAL
  Filled 2013-09-24 (×3): qty 1

## 2013-09-24 MED ORDER — HYDROCODONE-ACETAMINOPHEN 7.5-325 MG PO TABS
1.0000 | ORAL_TABLET | Freq: Four times a day (QID) | ORAL | Status: DC
  Administered 2013-09-24: 1 via ORAL
  Filled 2013-09-24: qty 1

## 2013-09-24 MED ORDER — HYDROMORPHONE HCL PF 2 MG/ML IJ SOLN
INTRAMUSCULAR | Status: AC
  Filled 2013-09-24: qty 1

## 2013-09-24 MED ORDER — MAGNESIUM CITRATE PO SOLN: 1.0000 | Freq: Once | ORAL | Status: AC | PRN

## 2013-09-24 MED ORDER — HYDROMORPHONE HCL PF 1 MG/ML IJ SOLN
INTRAMUSCULAR | Status: DC | PRN
  Administered 2013-09-24 (×2): 1 mg via INTRAVENOUS

## 2013-09-24 MED ORDER — GLYCOPYRROLATE 0.2 MG/ML IJ SOLN
INTRAMUSCULAR | Status: DC | PRN
  Administered 2013-09-24: 0.6 mg via INTRAVENOUS

## 2013-09-24 MED ORDER — CEFAZOLIN SODIUM-DEXTROSE 2-3 GM-% IV SOLR
2.0000 g | INTRAVENOUS | Status: AC
  Administered 2013-09-24: 2 g via INTRAVENOUS

## 2013-09-24 MED ORDER — ONDANSETRON HCL 4 MG/2ML IJ SOLN
INTRAMUSCULAR | Status: AC
  Filled 2013-09-24: qty 2

## 2013-09-24 MED ORDER — METHOCARBAMOL 500 MG PO TABS
500.0000 mg | ORAL_TABLET | Freq: Four times a day (QID) | ORAL | Status: DC | PRN
  Administered 2013-09-24 – 2013-09-25 (×2): 500 mg via ORAL
  Filled 2013-09-24 (×2): qty 1

## 2013-09-24 MED ORDER — MENTHOL 3 MG MT LOZG
1.0000 | LOZENGE | OROMUCOSAL | Status: DC | PRN
Start: 2013-09-24 — End: 2013-09-25
  Filled 2013-09-24: qty 9

## 2013-09-24 MED ORDER — TRANEXAMIC ACID 100 MG/ML IV SOLN
1000.0000 mg | Freq: Once | INTRAVENOUS | Status: AC
  Administered 2013-09-24: 1000 mg via INTRAVENOUS
  Filled 2013-09-24: qty 10

## 2013-09-24 MED ORDER — HYDROMORPHONE HCL PF 1 MG/ML IJ SOLN
0.2500 mg | INTRAMUSCULAR | Status: DC | PRN
  Administered 2013-09-24 (×4): 0.5 mg via INTRAVENOUS

## 2013-09-24 MED ORDER — MIDAZOLAM HCL 5 MG/5ML IJ SOLN
INTRAMUSCULAR | Status: DC | PRN
  Administered 2013-09-24: 2 mg via INTRAVENOUS

## 2013-09-24 MED ORDER — PROMETHAZINE HCL 25 MG/ML IJ SOLN: 6.2500 mg | INTRAMUSCULAR | Status: DC | PRN

## 2013-09-24 MED ORDER — PROPOFOL 10 MG/ML IV BOLUS
INTRAVENOUS | Status: AC
  Filled 2013-09-24: qty 20

## 2013-09-24 MED ORDER — FENTANYL CITRATE 0.05 MG/ML IJ SOLN
INTRAMUSCULAR | Status: DC | PRN
  Administered 2013-09-24: 100 ug via INTRAVENOUS
  Administered 2013-09-24 (×2): 50 ug via INTRAVENOUS

## 2013-09-24 MED ORDER — DOCUSATE SODIUM 100 MG PO CAPS
100.0000 mg | ORAL_CAPSULE | Freq: Two times a day (BID) | ORAL | Status: DC
  Administered 2013-09-24 – 2013-09-25 (×2): 100 mg via ORAL

## 2013-09-24 MED ORDER — ACETAMINOPHEN 10 MG/ML IV SOLN
1000.0000 mg | Freq: Once | INTRAVENOUS | Status: AC
  Administered 2013-09-24: 1000 mg via INTRAVENOUS
  Filled 2013-09-24: qty 100

## 2013-09-24 MED ORDER — NEOSTIGMINE METHYLSULFATE 10 MG/10ML IV SOLN
INTRAVENOUS | Status: DC | PRN
  Administered 2013-09-24: 4 mg via INTRAVENOUS

## 2013-09-24 MED ORDER — DEXTROSE 5 % IV SOLN
500.0000 mg | Freq: Four times a day (QID) | INTRAVENOUS | Status: DC | PRN
  Administered 2013-09-24: 500 mg via INTRAVENOUS
  Filled 2013-09-24: qty 5

## 2013-09-24 MED ORDER — ROCURONIUM BROMIDE 100 MG/10ML IV SOLN
INTRAVENOUS | Status: AC
  Filled 2013-09-24: qty 1

## 2013-09-24 MED ORDER — DEXAMETHASONE SODIUM PHOSPHATE 10 MG/ML IJ SOLN
10.0000 mg | Freq: Once | INTRAMUSCULAR | Status: AC
  Administered 2013-09-25: 10 mg via INTRAVENOUS
  Filled 2013-09-24: qty 1

## 2013-09-24 MED ORDER — ACETAMINOPHEN 500 MG PO TABS
1000.0000 mg | ORAL_TABLET | Freq: Four times a day (QID) | ORAL | Status: DC
  Administered 2013-09-24: 1000 mg via ORAL
  Filled 2013-09-24 (×2): qty 2

## 2013-09-24 MED ORDER — SODIUM CHLORIDE 0.9 % IV SOLN
INTRAVENOUS | Status: DC
  Administered 2013-09-24 – 2013-09-25 (×2): via INTRAVENOUS
  Filled 2013-09-24 (×5): qty 1000

## 2013-09-24 MED ORDER — ASPIRIN EC 325 MG PO TBEC
325.0000 mg | DELAYED_RELEASE_TABLET | Freq: Two times a day (BID) | ORAL | Status: DC
  Administered 2013-09-25: 325 mg via ORAL
  Filled 2013-09-24 (×3): qty 1

## 2013-09-24 MED ORDER — BUPIVACAINE HCL (PF) 0.5 % IJ SOLN
INTRAMUSCULAR | Status: AC
  Filled 2013-09-24: qty 30

## 2013-09-24 MED ORDER — ZOLPIDEM TARTRATE 5 MG PO TABS: 5.0000 mg | ORAL_TABLET | Freq: Every evening | ORAL | Status: DC | PRN

## 2013-09-24 MED ORDER — DEXAMETHASONE SODIUM PHOSPHATE 10 MG/ML IJ SOLN
INTRAMUSCULAR | Status: AC
  Filled 2013-09-24: qty 1

## 2013-09-24 SURGICAL SUPPLY — 44 items
ADH SKN CLS APL DERMABOND .7 (GAUZE/BANDAGES/DRESSINGS) ×1
BAG SPEC THK2 15X12 ZIP CLS (MISCELLANEOUS)
BAG ZIPLOCK 12X15 (MISCELLANEOUS) IMPLANT
CAPT HIP PF COP ×1 IMPLANT
COVER PERINEAL POST (MISCELLANEOUS) ×2 IMPLANT
DERMABOND ADVANCED (GAUZE/BANDAGES/DRESSINGS) ×1
DERMABOND ADVANCED .7 DNX12 (GAUZE/BANDAGES/DRESSINGS) ×1 IMPLANT
DRAPE C-ARM 42X120 X-RAY (DRAPES) ×2 IMPLANT
DRAPE STERI IOBAN 125X83 (DRAPES) ×2 IMPLANT
DRAPE U-SHAPE 47X51 STRL (DRAPES) ×6 IMPLANT
DRSG AQUACEL AG ADV 3.5X10 (GAUZE/BANDAGES/DRESSINGS) ×2 IMPLANT
DURAPREP 26ML APPLICATOR (WOUND CARE) ×2 IMPLANT
ELECT BLADE TIP CTD 4 INCH (ELECTRODE) ×2 IMPLANT
ELECT REM PT RETURN 9FT ADLT (ELECTROSURGICAL) ×2
ELECTRODE REM PT RTRN 9FT ADLT (ELECTROSURGICAL) ×1 IMPLANT
FACESHIELD WRAPAROUND (MASK) ×8 IMPLANT
FACESHIELD WRAPAROUND OR TEAM (MASK) ×4 IMPLANT
GLOVE BIO SURGEON STRL SZ7 (GLOVE) ×1 IMPLANT
GLOVE BIO SURGEON STRL SZ8 (GLOVE) ×1 IMPLANT
GLOVE BIOGEL M 7.0 STRL (GLOVE) ×1 IMPLANT
GLOVE BIOGEL PI IND STRL 6.5 (GLOVE) IMPLANT
GLOVE BIOGEL PI IND STRL 7.0 (GLOVE) IMPLANT
GLOVE BIOGEL PI IND STRL 7.5 (GLOVE) ×1 IMPLANT
GLOVE BIOGEL PI IND STRL 8.5 (GLOVE) IMPLANT
GLOVE BIOGEL PI INDICATOR 6.5 (GLOVE) ×1
GLOVE BIOGEL PI INDICATOR 7.0 (GLOVE) ×1
GLOVE BIOGEL PI INDICATOR 7.5 (GLOVE) ×2
GLOVE BIOGEL PI INDICATOR 8.5 (GLOVE) ×1
GLOVE ORTHO TXT STRL SZ7.5 (GLOVE) ×4 IMPLANT
GOWN STRL REUS W/TWL LRG LVL3 (GOWN DISPOSABLE) ×2 IMPLANT
GOWN STRL REUS W/TWL XL LVL3 (GOWN DISPOSABLE) ×3 IMPLANT
HOLDER FOLEY CATH W/STRAP (MISCELLANEOUS) ×2 IMPLANT
KIT BASIN OR (CUSTOM PROCEDURE TRAY) ×2 IMPLANT
PACK TOTAL JOINT (CUSTOM PROCEDURE TRAY) ×2 IMPLANT
SAW OSC TIP CART 19.5X105X1.3 (SAW) ×2 IMPLANT
SUT MNCRL AB 4-0 PS2 18 (SUTURE) ×2 IMPLANT
SUT VIC AB 1 CT1 36 (SUTURE) ×6 IMPLANT
SUT VIC AB 2-0 CT1 27 (SUTURE) ×6
SUT VIC AB 2-0 CT1 TAPERPNT 27 (SUTURE) ×2 IMPLANT
SUT VLOC 180 0 24IN GS25 (SUTURE) ×2 IMPLANT
TOWEL OR 17X26 10 PK STRL BLUE (TOWEL DISPOSABLE) ×2 IMPLANT
TOWEL OR NON WOVEN STRL DISP B (DISPOSABLE) ×1 IMPLANT
TRAY FOLEY CATH 14FRSI W/METER (CATHETERS) ×2 IMPLANT
WATER STERILE IRR 1500ML POUR (IV SOLUTION) ×2 IMPLANT

## 2013-09-24 NOTE — Anesthesia Preprocedure Evaluation (Addendum)
Anesthesia Evaluation  Patient identified by MRN, date of birth, ID band Patient awake    Reviewed: Allergy & Precautions, H&P , NPO status , Patient's Chart, lab work & pertinent test results  Airway Mallampati: IV  Neck ROM: Full    Dental  (+) Teeth Intact, Dental Advisory Given   Pulmonary neg pulmonary ROS,  breath sounds clear to auscultation        Cardiovascular hypertension, Rhythm:Regular Rate:Normal     Neuro/Psych negative neurological ROS  negative psych ROS   GI/Hepatic negative GI ROS, Neg liver ROS,   Endo/Other  negative endocrine ROS  Renal/GU negative Renal ROS  negative genitourinary   Musculoskeletal negative musculoskeletal ROS (+)   Abdominal   Peds  Hematology negative hematology ROS (+)   Anesthesia Other Findings   Reproductive/Obstetrics negative OB ROS                          Anesthesia Physical Anesthesia Plan  ASA: II  Anesthesia Plan: Spinal   Post-op Pain Management:    Induction: Intravenous  Airway Management Planned: Simple Face Mask  Additional Equipment: None  Intra-op Plan:   Post-operative Plan:   Informed Consent: I have reviewed the patients History and Physical, chart, labs and discussed the procedure including the risks, benefits and alternatives for the proposed anesthesia with the patient or authorized representative who has indicated his/her understanding and acceptance.   Dental advisory given  Plan Discussed with: CRNA, Anesthesiologist and Surgeon  Anesthesia Plan Comments:         Anesthesia Quick Evaluation

## 2013-09-24 NOTE — Op Note (Signed)
NAME:  [name]                ACCOUNT NO.: [id number]      MEDICAL RECORD NO.: [id number]      FACILITY:  [facility]      PHYSICIAN:  Durene Romans D  DATE OF BIRTH:  1950/07/14     DATE OF PROCEDURE:  09/24/2013                                 OPERATIVE REPORT         PREOPERATIVE DIAGNOSIS: Left  hip osteoarthritis.      POSTOPERATIVE DIAGNOSIS:  Left hip osteoarthritis.      PROCEDURE:  Left total hip replacement through an anterior approach   utilizing DePuy THR system, component size 52mm pinnacle cup, a size 36+4 neutral   Altrex liner, a size 4 Hi Tri Lock stem with a 36+1.5 delta ceramic   ball.      SURGEON:  Madlyn Frankel. Charlann Boxer, M.D.      ASSISTANT:  Skip Mayer, PA-C      ANESTHESIA:  General.      SPECIMENS:  None.      COMPLICATIONS:  None.      BLOOD LOSS:  600 cc     DRAINS:  None.      INDICATION OF THE PROCEDURE:  [name] is a 63 y.o. female who had   presented to office for evaluation of left hip pain.  Radiographs revealed   progressive degenerative changes with bone-on-bone   articulation to the  hip joint.  The patient had painful limited range of   motion significantly affecting their overall quality of life.  The patient was failing to    respond to conservative measures, and at this point was ready   to proceed with more definitive measures.  The patient has noted progressive   degenerative changes in his hip, progressive problems and dysfunction   with regarding the hip prior to surgery.  Consent was obtained for   benefit of pain relief.  Specific risk of infection, DVT, component   failure, dislocation, need for revision surgery, as well discussion of   the anterior versus posterior approach were reviewed.  Consent was   obtained for benefit of anterior pain relief through an anterior   approach.      PROCEDURE IN DETAIL:  The patient was brought to operative theater.   Once adequate anesthesia, preoperative  antibiotics, 2gm of Ancef administered.   The patient was positioned supine on the OSI Hanna table.  Once adequate   padding of boney process was carried out, we had predraped out the hip, and  used fluoroscopy to confirm orientation of the pelvis and position.      The left hip was then prepped and draped from proximal iliac crest to   mid thigh with shower curtain technique.      Time-out was performed identifying the patient, planned procedure, and   extremity.     An incision was then made 2 cm distal and lateral to the   anterior superior iliac spine extending over the orientation of the   tensor fascia lata muscle and sharp dissection was carried down to the   fascia of the muscle and protractor placed in the soft tissues.      The fascia was then incised.  The muscle belly was identified and  Tennis MustO76letta CohnGwynneth MacleodCaryn Beeanna [place] [id number] Bean71700 Rainbow BoulevardHillside HospitaTuAreta Haber BeanaMemorialcare Long [phone] lanMendota Com76mun35mtAGeorgia Retina Surgery Center LLCupNan7829tBar Cente DTEXTTAG>wara WillTurner Danielso re Bean>r's7 [place]1700 Rainbow BoulevardEndoscopy Center OAreta Haber tucky-LelanSonterra Procedure Center LLC Her4216 [address] [phone] Surgery Center LLCAG>anBann nter Inc179 Westport LaneMaryclare Bean30865Beverely RisenNorth Bay Medical [phone] DanielsLoren Racer55m62Oletta CohnTennis MustLurena NidaGwynneth MacleodCaryn BeeVanna [place]  Areta Haber[address] Tennis [phone] Gala RomneyTucson Surgery Center42m Levin Bacon41Wonda AmisDG64403Rich [employer]ve[employer]hes [employer]40.9  using Dermabond and   Aquacel dressing.  She was then brought   to recovery room in stable condition tolerating the procedure well.    Skip MayerBlair Roberts, PA-C was present for the entirety of the case involved from   preoperative positioning, perioperative retractor management, general   facilitation of the case, as well as primary wound closure as assistant.            Madlyn FrankelMatthew D. Charlann Boxerlin, M.D.        09/24/2013 1:26 PM

## 2013-09-24 NOTE — Anesthesia Postprocedure Evaluation (Signed)
  Anesthesia Post-op Note  Patient: Bo McclintockSusan B Dotts  Procedure(s) Performed: Procedure(s): LEFT TOTAL HIP ARTHROPLASTY ANTERIOR APPROACH (Left)  Patient Location: PACU  Anesthesia Type:General  Level of Consciousness: awake, alert  and oriented  Airway and Oxygen Therapy: Patient Spontanous Breathing  Post-op Pain: mild  Post-op Assessment: Post-op Vital signs reviewed  Post-op Vital Signs: Reviewed  Last Vitals:  Filed Vitals:   09/24/13 1500  BP: 135/60  Pulse: 79  Temp: 36.7 C  Resp: 13    Complications: No apparent anesthesia complications

## 2013-09-24 NOTE — Interval H&P Note (Signed)
History and Physical Interval Note:  09/24/2013 10:24 AM  [name]  has presented today for surgery, with the diagnosis of LEFT HIP OA  The various methods of treatment have been discussed with the patient and family. After consideration of risks, benefits and other options for treatment, the patient has consented to  Procedure(s): LEFT TOTAL HIP ARTHROPLASTY ANTERIOR APPROACH (Left) as a surgical intervention .  The patient's history has been reviewed, patient examined, no change in status, stable for surgery.  I have reviewed the patient's chart and labs.  Questions were answered to the patient's satisfaction.     Shelda PalLIN,Calogero Geisen D

## 2013-09-24 NOTE — Transfer of Care (Signed)
Immediate Anesthesia Transfer of Care Note  Patient: Stacy McclintockSusan B Farrell  Procedure(s) Performed: Procedure(s): LEFT TOTAL HIP ARTHROPLASTY ANTERIOR APPROACH (Left)  Patient Location: PACU  Anesthesia Type:General  Level of Consciousness: awake, alert  and oriented  Airway & Oxygen Therapy: Patient Spontanous Breathing and Patient connected to face mask oxygen  Post-op Assessment: Report given to PACU RN and Post -op Vital signs reviewed and stable  Post vital signs: Reviewed and stable  Complications: No apparent anesthesia complications

## 2013-09-25 DIAGNOSIS — D62 Acute posthemorrhagic anemia: Secondary | ICD-10-CM | POA: Diagnosis not present

## 2013-09-25 LAB — BASIC METABOLIC PANEL
Anion gap: 12 (ref 5–15)
BUN: 12 mg/dL (ref 6–23)
CALCIUM: 8.5 mg/dL (ref 8.4–10.5)
CO2: 23 meq/L (ref 19–32)
CREATININE: 0.68 mg/dL (ref 0.50–1.10)
Chloride: 101 mEq/L (ref 96–112)
GFR calc Af Amer: >90 mL/min (ref >90)
Glucose, Bld: 130 mg/dL — ABNORMAL HIGH (ref 70–99)
Potassium: 4.3 mEq/L (ref 3.7–5.3)
Sodium: 136 mEq/L — ABNORMAL LOW (ref 137–147)

## 2013-09-25 LAB — CBC
HCT: 33.2 % — ABNORMAL LOW (ref 36.0–46.0)
HEMOGLOBIN: 11.5 g/dL — AB (ref 12.0–15.0)
MCH: 28.5 pg (ref 26.0–34.0)
MCHC: 34.6 g/dL (ref 30.0–36.0)
MCV: 82.2 fL (ref 78.0–100.0)
Platelets: 161 10*3/uL (ref 150–400)
RBC: 4.04 MIL/uL (ref 3.87–5.11)
RDW: 13.5 % (ref 11.5–15.5)
WBC: 12.2 10*3/uL — ABNORMAL HIGH (ref 4.0–10.5)

## 2013-09-25 MED ORDER — ASPIRIN 325 MG PO TBEC: 325.0000 mg | DELAYED_RELEASE_TABLET | Freq: Two times a day (BID) | ORAL | Status: DC

## 2013-09-25 MED ORDER — DSS 100 MG PO CAPS: 100.0000 mg | ORAL_CAPSULE | Freq: Two times a day (BID) | ORAL | Status: DC

## 2013-09-25 MED ORDER — POLYETHYLENE GLYCOL 3350 17 G PO PACK: 17.0000 g | PACK | Freq: Two times a day (BID) | ORAL | Status: DC

## 2013-09-25 MED ORDER — FERROUS SULFATE 325 (65 FE) MG PO TABS: 325.0000 mg | ORAL_TABLET | Freq: Three times a day (TID) | ORAL | Status: DC

## 2013-09-25 MED ORDER — HYDROCODONE-ACETAMINOPHEN 7.5-325 MG PO TABS: 1.0000 | ORAL_TABLET | ORAL | Status: DC | PRN

## 2013-09-25 MED ORDER — METHOCARBAMOL 500 MG PO TABS: 500.0000 mg | ORAL_TABLET | Freq: Four times a day (QID) | ORAL | Status: DC | PRN

## 2013-09-25 NOTE — Progress Notes (Signed)
[facility]   Subjective: 1 Day Post-Op Procedure(s) (LRB): LEFT TOTAL HIP ARTHROPLASTY ANTERIOR APPROACH (Left)   Patient reports pain as mild, pain controlled. No events throughout the night. Ready to be discharged home if she does well with PT and pain stays controlled.  Objective:   VITALS:   Filed Vitals:   09/25/13 0512  BP: 107/68  Pulse: 73  Temp: 98.2 F (36.8 C)  Resp: 18    Dorsiflexion/Plantar flexion intact Incision: dressing C/D/I No cellulitis present Compartment soft  LABS  Recent Labs  09/25/13 0506  HGB 11.5*  HCT 33.2*  WBC 12.2*  PLT 161     Recent Labs  09/25/13 0506  NA 136*  K 4.3  BUN 12  CREATININE 0.68  GLUCOSE 130*     Assessment/Plan: 1 Day Post-Op Procedure(s) (LRB): LEFT TOTAL HIP ARTHROPLASTY ANTERIOR APPROACH (Left) Foley cath d/c'ed Advance diet Up with therapy D/C IV fluids Discharge home with home health if does well with PT  Expected ABLA  Treated with iron and will observe  Obese (BMI 30-39.9) Estimated body mass index is 39.94 kg/(m^2) as calculated from the following:   Height as of this encounter: 5\' 5"  (1.651 m).   Weight as of this encounter: 108.863 kg (240 lb). Patient also counseled that weight may inhibit the healing process Patient counseled that losing weight will help with future health issues       Anastasio AuerbachMatthew S. Mannie Ohlin   PAC  09/25/2013, 8:41 AM

## 2013-09-25 NOTE — Evaluation (Signed)
Physical Therapy Evaluation Patient Details Name: Stacy McclintockSusan B Darden MRN: [id number] DOB: 10/01/1950 Today's Date: 09/25/2013   History of Present Illness  LTHA-DA  Clinical Impression  Pt mobilizing well, demonstrates PWB. Pt will benefit from PT to address problems listed in note below.    Follow Up Recommendations Home health PT    Equipment Recommendations  Standard walker;Rolling walker with 5" wheels    Recommendations for Other Services       Precautions / Restrictions Precautions Precautions: Fall Restrictions Weight Bearing Restrictions: Yes LLE Weight Bearing: Partial weight bearing LLE Partial Weight Bearing Percentage or Pounds: 50%      Mobility  Bed Mobility Overal bed mobility: Needs Assistance Bed Mobility: Supine to Sit     Supine to sit: Min assist;HOB elevated     General bed mobility comments: support L l;eg to edge, cues for sequence.  Transfers Overall transfer level: Needs assistance Equipment used: Rolling walker (2 wheeled) Transfers: Sit to/from Stand Sit to Stand: Min assist         General transfer comment: cues for hand and L leg position  Ambulation/Gait Ambulation/Gait assistance: Min assist Ambulation Distance (Feet): 120 Feet Assistive device: Rolling walker (2 wheeled) Gait Pattern/deviations: Step-through pattern;Step-to pattern;Antalgic     General Gait Details: cues for sequence.  Stairs            Wheelchair Mobility    Modified Rankin (Stroke Patients Only)       Balance                                             Pertinent Vitals/Pain Pain Assessment: 0-10 Pain Score: 2  Pain Location: L incision Pain Descriptors / Indicators: Aching    Home Living Family/patient expects to be discharged to:: Private residence Living Arrangements: Spouse/significant other Available Help at Discharge: Family Type of Home: House Home Access: Stairs to enter Entrance Stairs-Rails:  Right Entrance Stairs-Number of Steps: 3 Home Layout: One level Home Equipment: Environmental consultantWalker - 2 wheels;Cane - single point;Bedside commode;Shower seat      Prior Function Level of Independence: Independent with assistive device(s)               Hand Dominance        Extremity/Trunk Assessment   Upper Extremity Assessment: Overall WFL for tasks assessed           Lower Extremity Assessment: LLE deficits/detail   LLE Deficits / Details: assist for  LLE to edge of bed, HOB raised     Communication   Communication: No difficulties  Cognition Arousal/Alertness: Awake/alert Behavior During Therapy: WFL for tasks assessed/performed Overall Cognitive Status: Within Functional Limits for tasks assessed                      General Comments      Exercises Total Joint Exercises Short Arc QuadBarbaraann Boys: AAROM;Left;10 reps;Supine Heel Slides: AAROM Hip ABduction/ADduction: AAROM;Left;10 reps;Supine      Assessment/Plan    PT Assessment Patient needs continued PT services  PT Diagnosis Difficulty walking   PT Problem List Decreased strength;Decreased range of motion;Decreased activity tolerance;Decreased knowledge of use of DME;Decreased safety awareness;Pain  PT Treatment Interventions DME instruction;Gait training;Stair training;Functional mobility training;Therapeutic activities;Therapeutic exercise;Patient/family education   PT Goals (Current goals can be found in the Care Plan section) Acute Rehab PT Goals Patient Stated Goal: I want to walk  without pain PT Goal Formulation: With patient/family Time For Goal Achievement: 09/27/13 Potential to Achieve Goals: Good    Frequency 7X/week   Barriers to discharge        Co-evaluation               End of Session   Activity Tolerance: Patient tolerated treatment well Patient left: in chair;with call bell/phone within reach;with family/visitor present Nurse Communication: Mobility status         Time:  9604-5409 PT Time Calculation (min): 23 min   Charges:   PT Evaluation $Initial PT Evaluation Tier I: 1 Procedure PT Treatments $Gait Training: 8-22 mins $Self Care/Home Management: 8-22   PT G Codes:          Rada Hay 09/25/2013, 9:24 AM

## 2013-09-25 NOTE — Progress Notes (Signed)
CARE MANAGEMENT NOTE 09/25/2013  Patient:  [name],[name]   Account Number:  [id number]  Date Initiated:  09/25/2013  Documentation initiated by:  Julian Askin  Subjective/Objective Assessment:   left hip replacement ant. approach     Action/Plan:   home   Anticipated DC Date:  09/25/2013   Anticipated DC Plan:  HOME W HOME HEALTH SERVICES  In-house referral  NA      DC Planning Services  CM consult      PAC Choice  NA   Choice offered to / List presented to:  C-1 Patient   DME arranged  NA      DME agency  NA     HH arranged  HH[facility]2 PT      [facility]   Status of service:  Completed, signed off Medicare Important Message given?  NA - LOS <3 / Initial given by admissions (If response is "NO", the following Medicare IM given date fields will be blank) Date Medicare IM given:   Medicare IM given by:   Date Additional Medicare IM given:   Additional Medicare IM given by:    Discharge Disposition:  HOME W HOME HEALTH SERVICES  Per UR Regulation:  Reviewed for med. necessity/level of care/duration of stay  If discussed at Long Length of Stay Meetings, dates discussed:    Comments:  [id number]/Tuan Tippin Earlene Plateravis, RN,BSN,CCM:

## 2013-09-25 NOTE — Evaluation (Signed)
Occupational Therapy Evaluation Patient Details Name: [name] MRN: [id number] DOB: 12-06-1950 Today's Date: 09/25/2013    History of Present Illness LTHA-DA   Clinical Impression   Pt presents to OT s/p THA.  Pt will benefit from HHOT to increase I with ADL activity . No further acute OT needed.    Follow Up Recommendations  Home health OT    Equipment Recommendations  None recommended by OT;Tub/shower bench (will defer to home health)       Precautions / Restrictions Precautions Precautions: Fall Restrictions Weight Bearing Restrictions: Yes LLE Weight Bearing: Partial weight bearing LLE Partial Weight Bearing Percentage or Pounds: 50%      Mobility Bed Mobility Overal bed mobility: Needs Assistance Bed Mobility: Supine to Sit     Supine to sit: Min assist;HOB elevated     General bed mobility comments: support L l;eg to edge, cues for sequence.  Transfers Overall transfer level: Needs assistance Equipment used: Rolling walker (2 wheeled) Transfers: Sit to/from Stand Sit to Stand: Min assist         General transfer comment: cues for hand and L leg position         ADL Overall ADL's : Needs assistance/impaired     Grooming: Standing;Set up;Min guard   Upper Body Bathing: Set up;Sitting   Lower Body Bathing: Sit to/from stand;Moderate assistance   Upper Body Dressing : Set up;Sitting   Lower Body Dressing: Moderate assistance;Sit to/from stand   Toilet Transfer: Min guard;Ambulation;RW Toilet Transfer Details (indicate cue type and reason): BSC over toilet Toileting- Clothing Manipulation and Hygiene: Minimal assistance;Sit to/from stand         General ADL Comments: Husband will A as needed with ADL activity.  Educated pt and husband on use of tub bench. Will defer decision to family.  Pt has AE.                 Pertinent Vitals/Pain Pain Assessment: 0-10 Pain Score: 8  Pain Location: l hip Pain Descriptors / Indicators:  Aching Pain Intervention(s): Repositioned;Ice applied;Patient requesting pain meds-RN notified     Hand Dominance     Extremity/Trunk Assessment Upper Extremity Assessment Upper Extremity Assessment: Overall WFL for tasks assessed   Lower Extremity Assessment Lower Extremity Assessment: LLE deficits/detail LLE Deficits / Details: assist for  LLE to edge of bed, HOB raised       Communication Communication Communication: No difficulties   Cognition Arousal/Alertness: Awake/alert Behavior During Therapy: WFL for tasks assessed/performed Overall Cognitive Status: Within Functional Limits for tasks assessed                     General Comments    Very motivated but was in significant pain after walking to bathroom and back. Educated on importance of asking for pain medicine and not getting ahead of her     Home Living Family/patient expects to be discharged to:: Private residence Living Arrangements: Spouse/significant other Available Help at Discharge: Family Type of Home: House Home Access: Stairs to enter Secretary/administrator of Steps: 3 Entrance Stairs-Rails: Right Home Layout: One level     Bathroom Shower/Tub: Tub/shower unit Shower/tub characteristics: Curtain Firefighter: Standard     Home Equipment: Environmental consultant - 2 wheels;Cane - single point;Bedside commode;Shower seat          Prior Functioning/Environment Level of Independence: Independent with assistive device(s)             OT Diagnosis: Generalized weakness   OT Problem List:  Decreased strength;Decreased activity tolerance   OT Treatment/Interventions:      OT Goals(Current goals can be found in the care plan section) Acute Rehab OT Goals Patient Stated Goal: I want to walk without pain  OT Frequency:                End of Session Nurse Communication: Weight bearing status  Activity Tolerance: Patient tolerated treatment well Patient left: in chair;with call bell/phone  within reach;with family/visitor present   Time: 1040-1110 OT Time Calculation (min): 30 min Charges:  OT General Charges $OT Visit: 1 Procedure OT Treatments $Self Care/Home Management : 23-37 mins G-Codes:    Einar CrowEDDING, Doni Bacha D 09/25/2013, 11:23 AM

## 2013-09-25 NOTE — Progress Notes (Signed)
[facility] the [facility]: Declined rw and commode - has both at home already.  If patient discharges after hours, please call [phone].   Renard HamperLecretia Williamson 09/25/2013, 10:11 AM

## 2013-09-25 NOTE — Progress Notes (Signed)
Physical Therapy Treatment Patient Details Name: Stacy McclintockSusan B Farrell MRN: [id number] DOB: 01/24/1951 Today's Date: 09/25/2013    History of Present Illness LTHA-DA    PT Comments    Pt reports she  Turned on  Toilet and had an increase in pain of L hip, Pt felt mobilizing helped with discomfort. Pt  Reiterated that she is PWB.  Follow Up Recommendations  Home health PT     Equipment Recommendations  Standard walker;Rolling walker with 5" wheels    Recommendations for Other Services       Precautions / Restrictions Precautions Precautions: Fall Restrictions Weight Bearing Restrictions: Yes LLE Weight Bearing: Partial weight bearing LLE Partial Weight Bearing Percentage or Pounds: 50%    Mobility  Bed Mobility Overal bed mobility: Needs Assistance Bed Mobility: Supine to Sit;Sit to Supine     Supine to sit: Min assist Sit to supine: Min assist   General bed mobility comments: spouse present to assist pt in/out of bed.  Transfers Overall transfer level: Needs assistance Equipment used: Rolling walker (2 wheeled) Transfers: Sit to/from Stand Sit to Stand: Min guard         General transfer comment: cues for hand and L leg position  Ambulation/Gait Ambulation/Gait assistance: Min guard Ambulation Distance (Feet): 50 Feet Assistive device: Rolling walker (2 wheeled) Gait Pattern/deviations: Step-through pattern;Antalgic     General Gait Details: cues for sequence.   Stairs Stairs: Yes Stairs assistance: Min assist Stair Management: One rail Right;Forwards;With cane Number of Stairs: 5 General stair comments: cues for sequence, PWB, spouse present for instruction and safety.  Wheelchair Mobility    Modified Rankin (Stroke Patients Only)       Balance                                    Cognition Arousal/Alertness: Awake/alert Behavior During Therapy: WFL for tasks assessed/performed Overall Cognitive Status: Within Functional Limits  for tasks assessed                      Exercises Total Joint Exercises Ankle Circles/Pumps: AROM;Both;10 reps;Supine Short Arc Quad: AROM;10 reps;Supine;Left Heel Slides: AAROM;Left;10 reps;Supine Hip ABduction/ADduction: AAROM;Left;Supine    General Comments        Pertinent Vitals/Pain Pain Score: 8  Pain Location: l hip Pain Descriptors / Indicators: Aching Pain Intervention(s): Repositioned;Ice applied;Patient requesting pain meds-RN notified    Home Living Family/patient expects to be discharged to:: Private residence Living Arrangements: Spouse/significant other Available Help at Discharge: Family Type of Home: House Home Access: Stairs to enter Entrance Stairs-Rails: Right Home Layout: One level Home Equipment: Environmental consultantWalker - 2 wheels;Cane - single point;Bedside commode;Shower seat      Prior Function Level of Independence: Independent with assistive device(s)          PT Goals (current goals can now be found in the care plan section) Acute Rehab PT Goals Patient Stated Goal: I want to walk without pain Progress towards PT goals: Progressing toward goals    Frequency  7X/week    PT Plan Current plan remains appropriate    Co-evaluation             End of Session   Activity Tolerance: Patient tolerated treatment well Patient left: in bed;with family/visitor present     Time: [id number] PT Time Calculation (min): 24 min  Charges:  $Gait Training: 8-22 mins $Therapeutic Exercise: 8-22 mins  G Codes:      Rada Hay 09/25/2013, 2:04 PM

## 2013-09-26 ENCOUNTER — Encounter (HOSPITAL_COMMUNITY): Payer: Self-pay | Admitting: Orthopedic Surgery

## 2013-10-02 NOTE — Discharge Summary (Signed)
Physician Discharge Summary  Patient ID: [name] MRN: [id number] DOB/AGE: Nov 28, 1950 63 y.o.  Admit date: 09/24/2013 Discharge date: 09/25/2013   Procedures:  Procedure(s) (LRB): LEFT TOTAL HIP ARTHROPLASTY ANTERIOR APPROACH (Left)  Attending Physician:  Dr. Durene Romans   Admission Diagnoses:   Left hip OA / pain  Discharge Diagnoses:  Principal Problem:   S/P left THA, AA Active Problems:   Morbid obesity   Postoperative anemia due to acute blood loss  Past Medical History  Diagnosis Date  . Hypertension   . Arthritis   . Lumbar disc herniation     hx. of. surgery 2'15    HPI: [name], 63 y.o. female, has a history of pain and functional disability in the left hip(s) due to arthritis and patient has failed non-surgical conservative treatments for greater than 12 weeks to include NSAID's and/or analgesics, use of assistive devices and activity modification. Onset of symptoms was gradual starting 1+ years ago with gradually worsening course since that time.The patient noted prior procedures of the hip to include arthroplasty on the right hip(s). Patient currently rates pain in the left hip at 10 out of 10 with activity. Patient has night pain, worsening of pain with activity and weight bearing, trendelenberg gait, pain that interfers with activities of daily living and pain with passive range of motion. Patient has evidence of periarticular osteophytes and joint space narrowing by imaging studies. This condition presents safety issues increasing the risk of falls. There is no current active infection. Risks, benefits and expectations were discussed with the patient. Risks including but not limited to the risk of anesthesia, blood clots, nerve damage, blood vessel damage, failure of the prosthesis, infection and up to and including death. Patient understand the risks, benefits and expectations and wishes to proceed with surgery.  PCP: Trinna Post, MD    Discharged Condition: good  Hospital Course:  Patient underwent the above stated procedure on 09/24/2013. Patient tolerated the procedure well and brought to the recovery room in good condition and subsequently to the floor.  POD #1 BP: 107/68 ; Pulse: 73 ; Temp: 98.2 F (36.8 C) ; Resp: 18 Patient reports pain as mild, pain controlled. No events throughout the night. Ready to be discharged home.  Dorsiflexion/plantar flexion intact, incision: dressing C/D/I, no cellulitis present and compartment soft.   LABS  Basename    HGB  11.5  HCT  33.2    Discharge Exam: General appearance: alert, cooperative and no distress Extremities: Homans sign is negative, no sign of DVT, no edema, redness or tenderness in the calves or thighs and no ulcers, gangrene or trophic changes  Disposition: Home with follow up in 2 weeks   Follow-up Information   Follow up with Shelda Pal, MD. Schedule an appointment as soon as possible for a visit in 2 weeks.   Specialty:  Orthopedic Surgery   Contact information:   [address] [place] [address] [phone]       Discharge Instructions   Call MD / Call 911    Complete by:  As directed   If you experience chest pain or shortness of breath, CALL 911 and be transported to the hospital emergency room.  If you develope a fever above 101 F, pus (white drainage) or increased drainage or redness at the wound, or calf pain, call your surgeon's office.     Change dressing    Complete by:  As directed   Maintain surgical dressing for  10-14 days, or until follow up in the clinic.     Constipation Prevention    Complete by:  As directed   Drink plenty of fluids.  Prune juice may be helpful.  You may use a stool softener, such as Colace (over the counter) 100 mg twice a day.  Use MiraLax (over the counter) for constipation as needed.     Diet - low sodium heart healthy    Complete by:  As directed      Discharge instructions     Complete by:  As directed   Maintain surgical dressing for 10-14 days, or until follow up in the clinic. [facility] Call with any questions or concerns.     Driving restrictions    Complete by:  As directed   No driving for 4 weeks     Partial weight bearing    Complete by:  As directed   For 4-6 weeks.  % Body Weight:  50  Laterality:  left  Extremity:  Lower     TED hose    Complete by:  As directed   Use stockings (TED hose) for 2 weeks on both leg(s).  You may remove them at night for sleeping.             Medication List    STOP taking these medications       HYDROcodone-acetaminophen 10-325 MG per tablet  Commonly known as:  NORCO  Replaced by:  HYDROcodone-acetaminophen 7.5-325 MG per tablet      TAKE these medications       aspirin 325 MG EC tablet  Take 1 tablet (325 mg total) by mouth 2 (two) times daily.     DSS 100 MG Caps  Take 100 mg by mouth 2 (two) times daily.     ferrous sulfate 325 (65 FE) MG tablet  Take 1 tablet (325 mg total) by mouth 3 (three) times daily after meals.     HYDROcodone-acetaminophen 7.5-325 MG per tablet  Commonly known as:  NORCO  Take 1-2 tablets by mouth every 4 (four) hours as needed for moderate pain.     lisinopril-hydrochlorothiazide 10-12.5 MG per tablet  Commonly known as:  PRINZIDE,ZESTORETIC  Take 2 tablets by mouth every morning.     methocarbamol 500 MG tablet  Commonly known as:  ROBAXIN  Take 1 tablet (500 mg total) by mouth every 6 (six) hours as needed for muscle spasms.     polyethylene glycol packet  Commonly known as:  MIRALAX / GLYCOLAX  Take 17 g by mouth 2 (two) times daily.         Signed: Anastasio AuerbachMatthew S. Kaiden Pech   PA-C  10/02/2013, 10:46 PM

## 2015-01-02 DIAGNOSIS — I10 Essential (primary) hypertension: Secondary | ICD-10-CM | POA: Diagnosis not present

## 2015-01-02 DIAGNOSIS — Z1389 Encounter for screening for other disorder: Secondary | ICD-10-CM | POA: Diagnosis not present

## 2015-01-02 DIAGNOSIS — Z0001 Encounter for general adult medical examination with abnormal findings: Secondary | ICD-10-CM | POA: Diagnosis not present

## 2015-01-02 DIAGNOSIS — Z23 Encounter for immunization: Secondary | ICD-10-CM | POA: Diagnosis not present

## 2015-01-02 DIAGNOSIS — Z6841 Body Mass Index (BMI) 40.0 and over, adult: Secondary | ICD-10-CM | POA: Diagnosis not present

## 2015-01-02 DIAGNOSIS — M1990 Unspecified osteoarthritis, unspecified site: Secondary | ICD-10-CM | POA: Diagnosis not present

## 2015-07-15 DIAGNOSIS — I10 Essential (primary) hypertension: Secondary | ICD-10-CM | POA: Diagnosis not present

## 2015-07-15 DIAGNOSIS — E669 Obesity, unspecified: Secondary | ICD-10-CM | POA: Diagnosis not present

## 2015-07-15 DIAGNOSIS — Z1389 Encounter for screening for other disorder: Secondary | ICD-10-CM | POA: Diagnosis not present

## 2015-07-15 DIAGNOSIS — Z6841 Body Mass Index (BMI) 40.0 and over, adult: Secondary | ICD-10-CM | POA: Diagnosis not present

## 2015-08-10 ENCOUNTER — Other Ambulatory Visit: Payer: Self-pay | Admitting: Registered Nurse

## 2015-08-10 DIAGNOSIS — Z1231 Encounter for screening mammogram for malignant neoplasm of breast: Secondary | ICD-10-CM

## 2015-08-12 ENCOUNTER — Other Ambulatory Visit (HOSPITAL_COMMUNITY): Payer: Self-pay | Admitting: Registered Nurse

## 2015-08-12 DIAGNOSIS — Z1231 Encounter for screening mammogram for malignant neoplasm of breast: Secondary | ICD-10-CM

## 2015-09-25 ENCOUNTER — Other Ambulatory Visit (HOSPITAL_COMMUNITY): Payer: Self-pay | Admitting: Registered Nurse

## 2015-09-25 DIAGNOSIS — Z1231 Encounter for screening mammogram for malignant neoplasm of breast: Secondary | ICD-10-CM

## 2015-09-28 ENCOUNTER — Ambulatory Visit (HOSPITAL_COMMUNITY)
Admission: RE | Admit: 2015-09-28 | Discharge: 2015-09-28 | Disposition: A | Discharge status: Home / Self Care | Payer: Medicare Other | Source: Ambulatory Visit | Attending: Registered Nurse | Admitting: Registered Nurse

## 2015-09-28 DIAGNOSIS — Z1231 Encounter for screening mammogram for malignant neoplasm of breast: Secondary | ICD-10-CM | POA: Insufficient documentation

## 2015-10-05 ENCOUNTER — Other Ambulatory Visit: Payer: Self-pay | Admitting: Registered Nurse

## 2015-10-05 DIAGNOSIS — R928 Other abnormal and inconclusive findings on diagnostic imaging of breast: Secondary | ICD-10-CM

## 2015-10-13 ENCOUNTER — Ambulatory Visit (HOSPITAL_COMMUNITY)
Admission: RE | Admit: 2015-10-13 | Discharge: 2015-10-13 | Disposition: A | Discharge status: Home / Self Care | Payer: Medicare Other | Source: Ambulatory Visit | Attending: Registered Nurse | Admitting: Registered Nurse

## 2015-10-13 DIAGNOSIS — N63 Unspecified lump in breast: Secondary | ICD-10-CM | POA: Diagnosis not present

## 2015-10-13 DIAGNOSIS — R928 Other abnormal and inconclusive findings on diagnostic imaging of breast: Secondary | ICD-10-CM

## 2015-11-30 IMAGING — CR DG CHEST 2V
2 series · 2 of 2 positions shown · non-contrast
Comparison: None.

CLINICAL DATA: Hypertension.

EXAM:
CHEST  2 VIEW

[w chest pa]
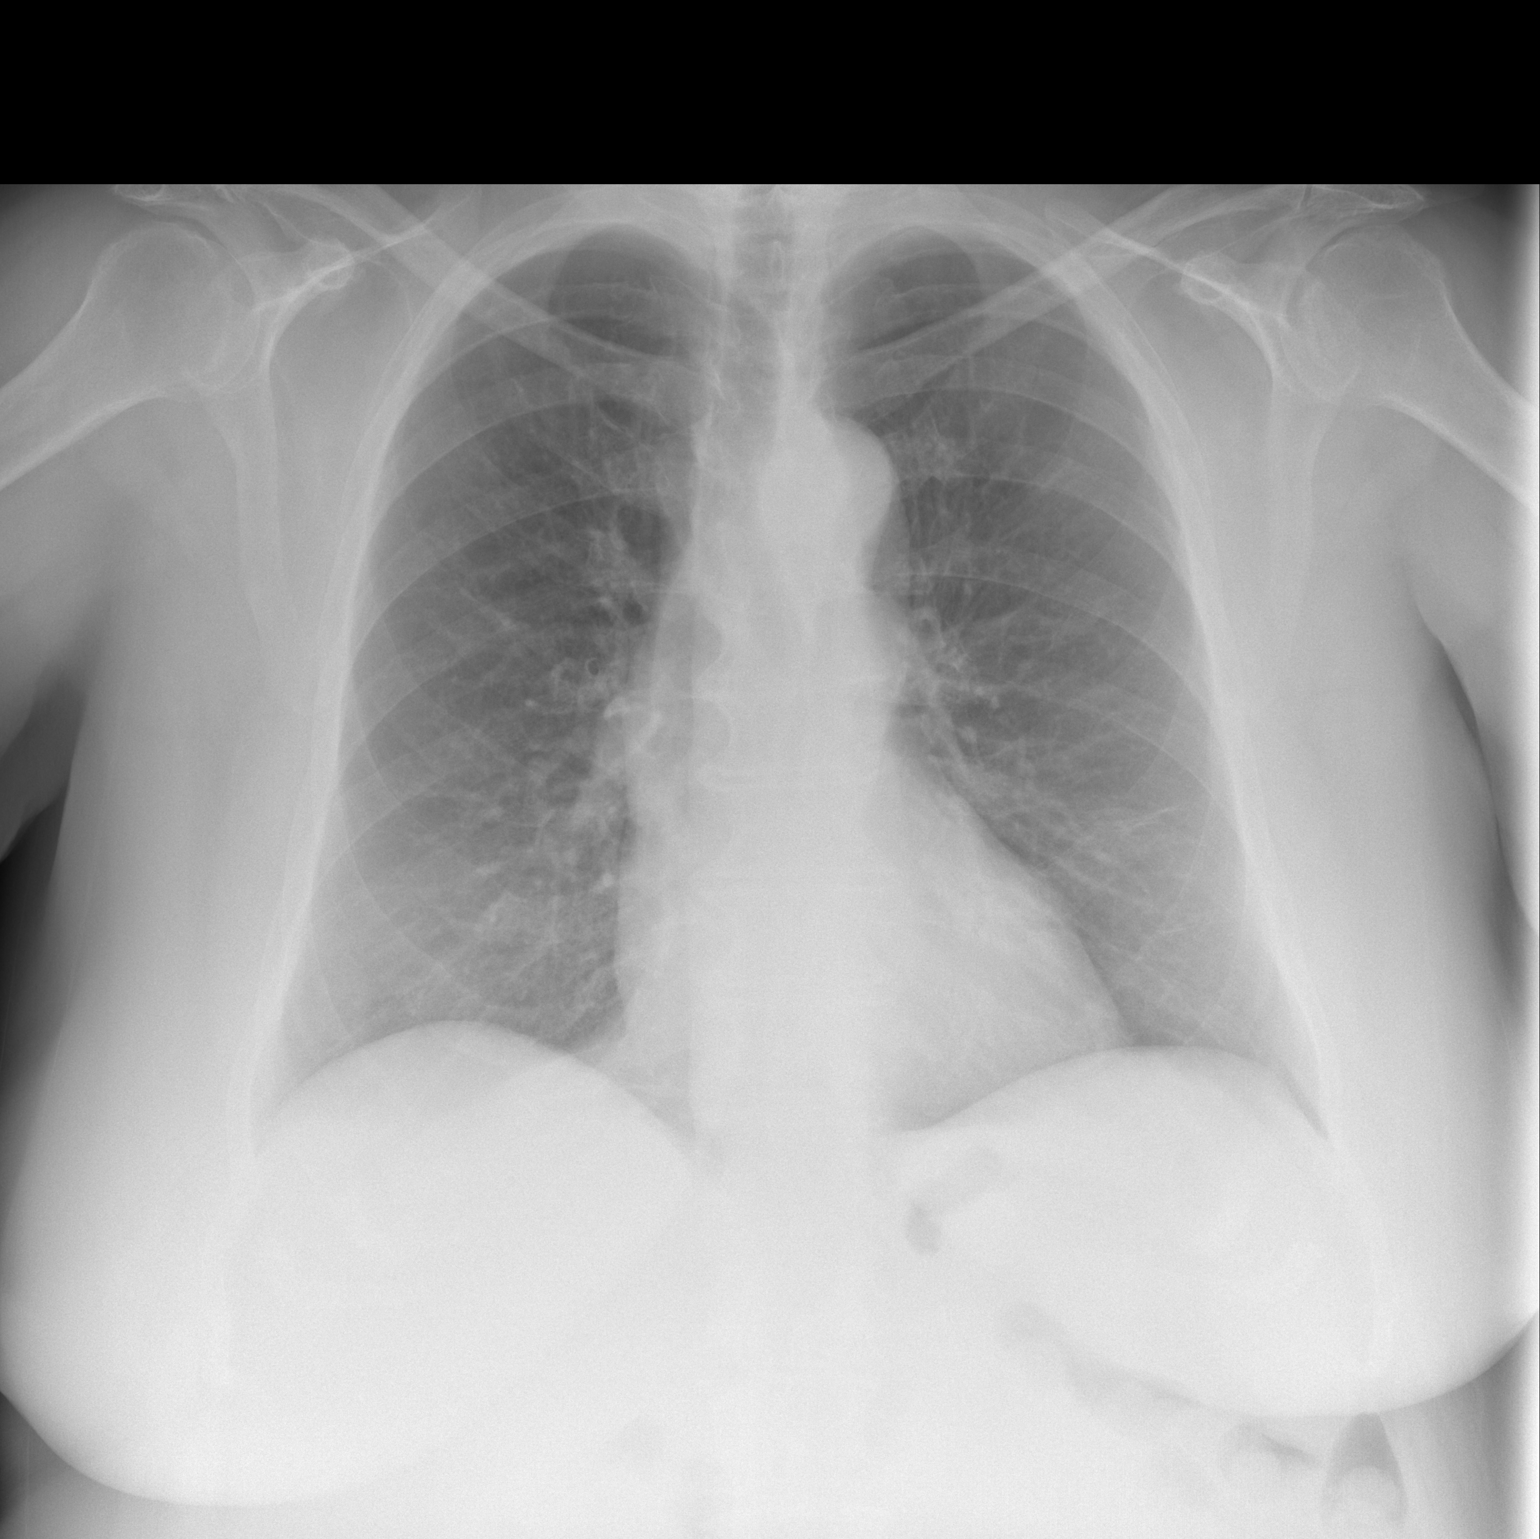

[w chest lat]
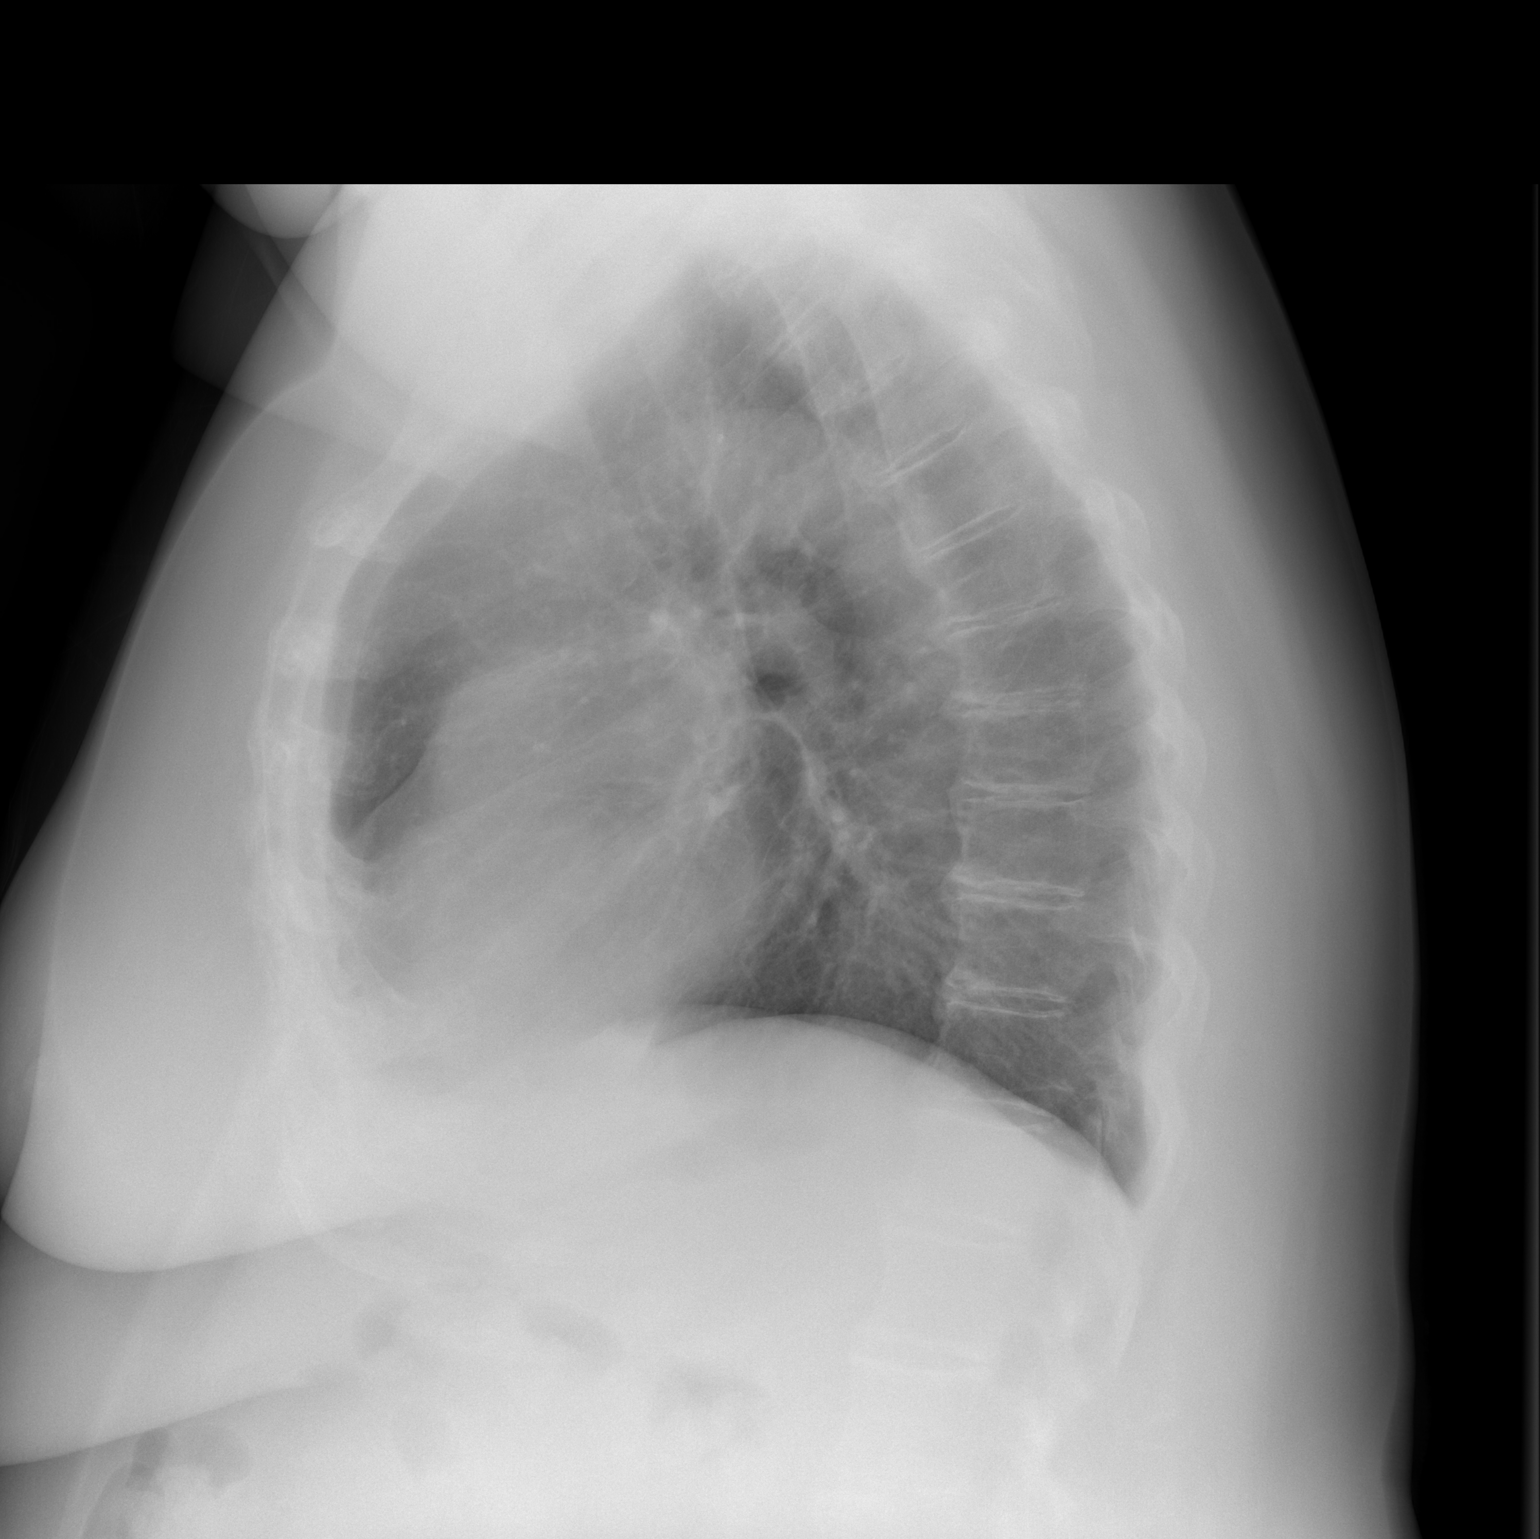

[2 of 2 positions shown; findings below may reference images not displayed]

FINDINGS: The heart size and mediastinal contours are within normal limits.
Both lungs are clear. The visualized skeletal structures are
unremarkable.
IMPRESSION: No active cardiopulmonary disease.

## 2015-12-07 DIAGNOSIS — Z23 Encounter for immunization: Secondary | ICD-10-CM | POA: Diagnosis not present

## 2015-12-07 IMAGING — CR DG SPINE 1V PORT
1 series · 1 of 1 positions shown · non-contrast
Comparison: Portable cross-table lateral view of the lumbar spine
at 7499 hr compared to the earlier study of 0347 hr

CLINICAL DATA: L5-S1 surgical level

EXAM:
PORTABLE SPINE - 1 VIEW

[lateral]
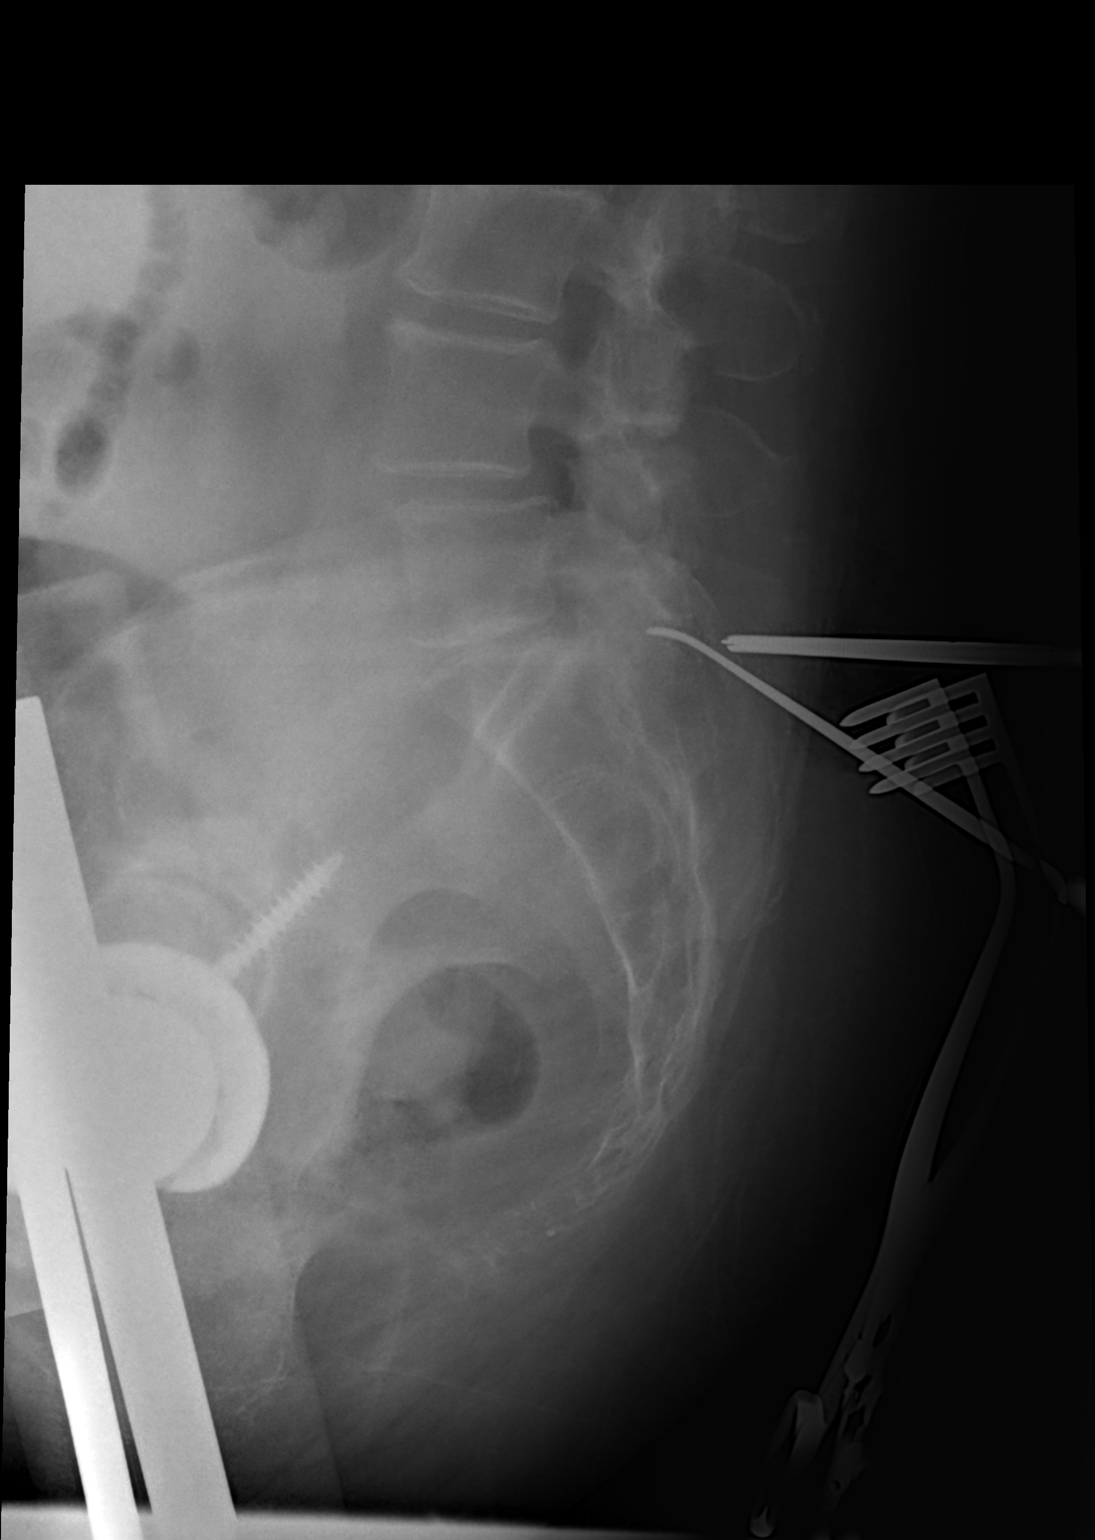

[1 of 1 positions shown; findings below may reference images not displayed]

FINDINGS: Prior exam labeled with 5 lumbar vertebrae, current exam labeled
similarly.

2 metallic probes via dorsal approach are identified with tips
projecting dorsal to the L5-S1 disc space.

Tissues better projects dorsal to the sacrum.
IMPRESSION: Posterior localization of the L5-S1 disc space level.

## 2016-01-01 DIAGNOSIS — R062 Wheezing: Secondary | ICD-10-CM | POA: Diagnosis not present

## 2016-01-01 DIAGNOSIS — I10 Essential (primary) hypertension: Secondary | ICD-10-CM | POA: Diagnosis not present

## 2016-01-01 DIAGNOSIS — Z1389 Encounter for screening for other disorder: Secondary | ICD-10-CM | POA: Diagnosis not present

## 2016-01-01 DIAGNOSIS — Z6841 Body Mass Index (BMI) 40.0 and over, adult: Secondary | ICD-10-CM | POA: Diagnosis not present

## 2016-01-01 DIAGNOSIS — J029 Acute pharyngitis, unspecified: Secondary | ICD-10-CM | POA: Diagnosis not present

## 2016-01-01 DIAGNOSIS — R0981 Nasal congestion: Secondary | ICD-10-CM | POA: Diagnosis not present

## 2016-08-01 DIAGNOSIS — Z1211 Encounter for screening for malignant neoplasm of colon: Secondary | ICD-10-CM | POA: Diagnosis not present

## 2016-12-21 DIAGNOSIS — Z23 Encounter for immunization: Secondary | ICD-10-CM | POA: Diagnosis not present

## 2017-03-22 DIAGNOSIS — Z1389 Encounter for screening for other disorder: Secondary | ICD-10-CM | POA: Diagnosis not present

## 2017-03-22 DIAGNOSIS — I10 Essential (primary) hypertension: Secondary | ICD-10-CM | POA: Diagnosis not present

## 2017-03-22 DIAGNOSIS — Z6841 Body Mass Index (BMI) 40.0 and over, adult: Secondary | ICD-10-CM | POA: Diagnosis not present

## 2017-03-22 DIAGNOSIS — R7309 Other abnormal glucose: Secondary | ICD-10-CM | POA: Diagnosis not present

## 2017-03-23 DIAGNOSIS — K7689 Other specified diseases of liver: Secondary | ICD-10-CM | POA: Diagnosis not present

## 2017-03-23 DIAGNOSIS — R945 Abnormal results of liver function studies: Secondary | ICD-10-CM | POA: Diagnosis not present

## 2017-05-03 DIAGNOSIS — E039 Hypothyroidism, unspecified: Secondary | ICD-10-CM | POA: Diagnosis not present

## 2017-05-03 DIAGNOSIS — R7309 Other abnormal glucose: Secondary | ICD-10-CM | POA: Diagnosis not present

## 2017-05-03 DIAGNOSIS — Z6841 Body Mass Index (BMI) 40.0 and over, adult: Secondary | ICD-10-CM | POA: Diagnosis not present

## 2017-05-03 DIAGNOSIS — R945 Abnormal results of liver function studies: Secondary | ICD-10-CM | POA: Diagnosis not present

## 2017-05-03 DIAGNOSIS — D72829 Elevated white blood cell count, unspecified: Secondary | ICD-10-CM | POA: Diagnosis not present

## 2017-05-03 DIAGNOSIS — E119 Type 2 diabetes mellitus without complications: Secondary | ICD-10-CM | POA: Diagnosis not present

## 2017-05-15 DIAGNOSIS — L600 Ingrowing nail: Secondary | ICD-10-CM | POA: Diagnosis not present

## 2017-05-15 DIAGNOSIS — M79675 Pain in left toe(s): Secondary | ICD-10-CM | POA: Diagnosis not present

## 2017-05-29 DIAGNOSIS — B351 Tinea unguium: Secondary | ICD-10-CM | POA: Diagnosis not present

## 2017-05-29 DIAGNOSIS — M79675 Pain in left toe(s): Secondary | ICD-10-CM | POA: Diagnosis not present

## 2017-07-17 DIAGNOSIS — R7309 Other abnormal glucose: Secondary | ICD-10-CM | POA: Diagnosis not present

## 2017-07-17 DIAGNOSIS — Z0001 Encounter for general adult medical examination with abnormal findings: Secondary | ICD-10-CM | POA: Diagnosis not present

## 2017-07-17 DIAGNOSIS — I10 Essential (primary) hypertension: Secondary | ICD-10-CM | POA: Diagnosis not present

## 2017-07-17 DIAGNOSIS — Z6841 Body Mass Index (BMI) 40.0 and over, adult: Secondary | ICD-10-CM | POA: Diagnosis not present

## 2017-07-17 DIAGNOSIS — E039 Hypothyroidism, unspecified: Secondary | ICD-10-CM | POA: Diagnosis not present

## 2017-07-17 DIAGNOSIS — R945 Abnormal results of liver function studies: Secondary | ICD-10-CM | POA: Diagnosis not present

## 2017-07-17 DIAGNOSIS — Z1389 Encounter for screening for other disorder: Secondary | ICD-10-CM | POA: Diagnosis not present

## 2017-08-15 DIAGNOSIS — H40033 Anatomical narrow angle, bilateral: Secondary | ICD-10-CM | POA: Diagnosis not present

## 2017-08-15 DIAGNOSIS — H04123 Dry eye syndrome of bilateral lacrimal glands: Secondary | ICD-10-CM | POA: Diagnosis not present

## 2017-08-31 DIAGNOSIS — H25812 Combined forms of age-related cataract, left eye: Secondary | ICD-10-CM | POA: Diagnosis not present

## 2017-08-31 DIAGNOSIS — H35371 Puckering of macula, right eye: Secondary | ICD-10-CM | POA: Diagnosis not present

## 2017-08-31 DIAGNOSIS — H2512 Age-related nuclear cataract, left eye: Secondary | ICD-10-CM | POA: Diagnosis not present

## 2017-09-07 DIAGNOSIS — H04123 Dry eye syndrome of bilateral lacrimal glands: Secondary | ICD-10-CM | POA: Diagnosis not present

## 2017-09-14 DIAGNOSIS — H2511 Age-related nuclear cataract, right eye: Secondary | ICD-10-CM | POA: Diagnosis not present

## 2017-09-14 DIAGNOSIS — H25811 Combined forms of age-related cataract, right eye: Secondary | ICD-10-CM | POA: Diagnosis not present

## 2017-09-21 DIAGNOSIS — H04123 Dry eye syndrome of bilateral lacrimal glands: Secondary | ICD-10-CM | POA: Diagnosis not present

## 2017-12-05 DIAGNOSIS — Z23 Encounter for immunization: Secondary | ICD-10-CM | POA: Diagnosis not present

## 2017-12-21 DIAGNOSIS — Z1211 Encounter for screening for malignant neoplasm of colon: Secondary | ICD-10-CM | POA: Diagnosis not present

## 2018-08-30 DIAGNOSIS — E119 Type 2 diabetes mellitus without complications: Secondary | ICD-10-CM | POA: Diagnosis not present

## 2018-08-30 DIAGNOSIS — Z6841 Body Mass Index (BMI) 40.0 and over, adult: Secondary | ICD-10-CM | POA: Diagnosis not present

## 2018-08-30 DIAGNOSIS — E039 Hypothyroidism, unspecified: Secondary | ICD-10-CM | POA: Diagnosis not present

## 2018-08-30 DIAGNOSIS — R945 Abnormal results of liver function studies: Secondary | ICD-10-CM | POA: Diagnosis not present

## 2018-08-30 DIAGNOSIS — Z1389 Encounter for screening for other disorder: Secondary | ICD-10-CM | POA: Diagnosis not present

## 2018-08-30 DIAGNOSIS — I10 Essential (primary) hypertension: Secondary | ICD-10-CM | POA: Diagnosis not present

## 2018-08-30 DIAGNOSIS — Z0000 Encounter for general adult medical examination without abnormal findings: Secondary | ICD-10-CM | POA: Diagnosis not present

## 2018-09-03 DIAGNOSIS — Z1211 Encounter for screening for malignant neoplasm of colon: Secondary | ICD-10-CM | POA: Diagnosis not present

## 2018-09-17 ENCOUNTER — Other Ambulatory Visit: Payer: Self-pay

## 2018-10-10 DIAGNOSIS — Z0000 Encounter for general adult medical examination without abnormal findings: Secondary | ICD-10-CM | POA: Diagnosis not present

## 2018-10-10 DIAGNOSIS — Z6841 Body Mass Index (BMI) 40.0 and over, adult: Secondary | ICD-10-CM | POA: Diagnosis not present

## 2018-10-10 DIAGNOSIS — Z13220 Encounter for screening for lipoid disorders: Secondary | ICD-10-CM | POA: Diagnosis not present

## 2018-10-10 DIAGNOSIS — Z1389 Encounter for screening for other disorder: Secondary | ICD-10-CM | POA: Diagnosis not present

## 2018-10-10 DIAGNOSIS — E119 Type 2 diabetes mellitus without complications: Secondary | ICD-10-CM | POA: Diagnosis not present

## 2018-10-10 DIAGNOSIS — E039 Hypothyroidism, unspecified: Secondary | ICD-10-CM | POA: Diagnosis not present

## 2018-12-03 DIAGNOSIS — Z23 Encounter for immunization: Secondary | ICD-10-CM | POA: Diagnosis not present

## 2019-01-14 DIAGNOSIS — I10 Essential (primary) hypertension: Secondary | ICD-10-CM | POA: Diagnosis not present

## 2019-01-14 DIAGNOSIS — E119 Type 2 diabetes mellitus without complications: Secondary | ICD-10-CM | POA: Diagnosis not present

## 2019-02-14 DIAGNOSIS — E119 Type 2 diabetes mellitus without complications: Secondary | ICD-10-CM | POA: Diagnosis not present

## 2019-02-14 DIAGNOSIS — E039 Hypothyroidism, unspecified: Secondary | ICD-10-CM | POA: Diagnosis not present

## 2019-02-14 DIAGNOSIS — I10 Essential (primary) hypertension: Secondary | ICD-10-CM | POA: Diagnosis not present

## 2019-04-28 DIAGNOSIS — Z23 Encounter for immunization: Secondary | ICD-10-CM | POA: Diagnosis not present

## 2020-02-01 DIAGNOSIS — Z23 Encounter for immunization: Secondary | ICD-10-CM | POA: Diagnosis not present

## 2020-05-01 DIAGNOSIS — Z1211 Encounter for screening for malignant neoplasm of colon: Secondary | ICD-10-CM | POA: Diagnosis not present

## 2020-08-15 DIAGNOSIS — Z20822 Contact with and (suspected) exposure to covid-19: Secondary | ICD-10-CM | POA: Diagnosis not present

## 2020-12-21 DIAGNOSIS — Z20822 Contact with and (suspected) exposure to covid-19: Secondary | ICD-10-CM | POA: Diagnosis not present

## 2020-12-31 ENCOUNTER — Emergency Department (HOSPITAL_COMMUNITY): Payer: Medicare Other

## 2020-12-31 ENCOUNTER — Other Ambulatory Visit: Payer: Self-pay

## 2020-12-31 ENCOUNTER — Observation Stay (HOSPITAL_COMMUNITY)
Admission: EM | Admit: 2020-12-31 | Discharge: 2021-01-01 | Disposition: A | Discharge status: Home / Self Care | Payer: Medicare Other | Attending: Internal Medicine | Admitting: Internal Medicine

## 2020-12-31 ENCOUNTER — Encounter (HOSPITAL_COMMUNITY): Payer: Self-pay | Admitting: Emergency Medicine

## 2020-12-31 DIAGNOSIS — Z79899 Other long term (current) drug therapy: Secondary | ICD-10-CM | POA: Insufficient documentation

## 2020-12-31 DIAGNOSIS — Z20822 Contact with and (suspected) exposure to covid-19: Secondary | ICD-10-CM | POA: Insufficient documentation

## 2020-12-31 DIAGNOSIS — Z96651 Presence of right artificial knee joint: Secondary | ICD-10-CM | POA: Insufficient documentation

## 2020-12-31 DIAGNOSIS — I4891 Unspecified atrial fibrillation: Secondary | ICD-10-CM | POA: Diagnosis not present

## 2020-12-31 DIAGNOSIS — R0789 Other chest pain: Secondary | ICD-10-CM | POA: Diagnosis present

## 2020-12-31 DIAGNOSIS — R7989 Other specified abnormal findings of blood chemistry: Secondary | ICD-10-CM

## 2020-12-31 DIAGNOSIS — I110 Hypertensive heart disease with heart failure: Secondary | ICD-10-CM | POA: Insufficient documentation

## 2020-12-31 DIAGNOSIS — R778 Other specified abnormalities of plasma proteins: Secondary | ICD-10-CM | POA: Insufficient documentation

## 2020-12-31 DIAGNOSIS — R739 Hyperglycemia, unspecified: Secondary | ICD-10-CM | POA: Diagnosis not present

## 2020-12-31 DIAGNOSIS — M48062 Spinal stenosis, lumbar region with neurogenic claudication: Secondary | ICD-10-CM | POA: Diagnosis not present

## 2020-12-31 DIAGNOSIS — E669 Obesity, unspecified: Secondary | ICD-10-CM | POA: Diagnosis not present

## 2020-12-31 DIAGNOSIS — I480 Paroxysmal atrial fibrillation: Secondary | ICD-10-CM | POA: Diagnosis not present

## 2020-12-31 DIAGNOSIS — R079 Chest pain, unspecified: Secondary | ICD-10-CM | POA: Diagnosis not present

## 2020-12-31 DIAGNOSIS — R000 Tachycardia, unspecified: Secondary | ICD-10-CM | POA: Diagnosis not present

## 2020-12-31 DIAGNOSIS — R0902 Hypoxemia: Secondary | ICD-10-CM | POA: Diagnosis not present

## 2020-12-31 DIAGNOSIS — I509 Heart failure, unspecified: Secondary | ICD-10-CM | POA: Diagnosis not present

## 2020-12-31 DIAGNOSIS — Z96643 Presence of artificial hip joint, bilateral: Secondary | ICD-10-CM | POA: Diagnosis not present

## 2020-12-31 DIAGNOSIS — I517 Cardiomegaly: Secondary | ICD-10-CM | POA: Diagnosis not present

## 2020-12-31 DIAGNOSIS — Z7982 Long term (current) use of aspirin: Secondary | ICD-10-CM | POA: Insufficient documentation

## 2020-12-31 DIAGNOSIS — I10 Essential (primary) hypertension: Secondary | ICD-10-CM | POA: Diagnosis not present

## 2020-12-31 LAB — CBC
HCT: 42.9 % (ref 36.0–46.0)
Hemoglobin: 14.5 g/dL (ref 12.0–15.0)
MCH: 28.9 pg (ref 26.0–34.0)
MCHC: 33.8 g/dL (ref 30.0–36.0)
MCV: 85.5 fL (ref 80.0–100.0)
Platelets: 217 10*3/uL (ref 150–400)
RBC: 5.02 MIL/uL (ref 3.87–5.11)
RDW: 14.4 % (ref 11.5–15.5)
WBC: 10.7 10*3/uL — ABNORMAL HIGH (ref 4.0–10.5)
nRBC: 0 % (ref 0.0–0.2)

## 2020-12-31 LAB — BASIC METABOLIC PANEL
Anion gap: 10 (ref 5–15)
BUN: 15 mg/dL (ref 8–23)
CO2: 23 mmol/L (ref 22–32)
Calcium: 8.8 mg/dL — ABNORMAL LOW (ref 8.9–10.3)
Chloride: 102 mmol/L (ref 98–111)
Creatinine, Ser: 0.74 mg/dL (ref 0.44–1.00)
GFR, Estimated: >60 mL/min (ref >60)
Glucose, Bld: 251 mg/dL — ABNORMAL HIGH (ref 70–99)
Potassium: 3.5 mmol/L (ref 3.5–5.1)
Sodium: 135 mmol/L (ref 135–145)

## 2020-12-31 LAB — RESP PANEL BY RT-PCR (FLU A&B, COVID) ARPGX2
Influenza A by PCR: NEGATIVE
Influenza B by PCR: NEGATIVE
SARS Coronavirus 2 by RT PCR: NEGATIVE

## 2020-12-31 LAB — BRAIN NATRIURETIC PEPTIDE: B Natriuretic Peptide: 351 pg/mL — ABNORMAL HIGH (ref 0.0–100.0)

## 2020-12-31 LAB — TROPONIN I (HIGH SENSITIVITY)
Troponin I (High Sensitivity): 30 ng/L — ABNORMAL HIGH (ref <18)
Troponin I (High Sensitivity): 58 ng/L — ABNORMAL HIGH (ref <18)

## 2020-12-31 MED ORDER — DILTIAZEM HCL 25 MG/5ML IV SOLN
20.0000 mg | Freq: Once | INTRAVENOUS | Status: AC
  Administered 2020-12-31: 20:00:00 20 mg via INTRAVENOUS
  Filled 2020-12-31: qty 5

## 2020-12-31 MED ORDER — ACETAMINOPHEN 325 MG PO TABS
650.0000 mg | ORAL_TABLET | Freq: Once | ORAL | Status: DC | PRN
  Filled 2020-12-31: qty 2

## 2020-12-31 MED ORDER — DILTIAZEM HCL 25 MG/5ML IV SOLN: 20.0000 mg | Freq: Once | INTRAVENOUS | Status: DC

## 2020-12-31 MED ORDER — ENOXAPARIN SODIUM 40 MG/0.4ML IJ SOSY
40.0000 mg | PREFILLED_SYRINGE | INTRAMUSCULAR | Status: DC
  Administered 2021-01-01: 40 mg via SUBCUTANEOUS
  Filled 2020-12-31: qty 0.4

## 2020-12-31 MED ORDER — ASPIRIN 81 MG PO CHEW
324.0000 mg | CHEWABLE_TABLET | Freq: Once | ORAL | Status: AC
  Administered 2020-12-31: 21:00:00 324 mg via ORAL
  Filled 2020-12-31: qty 4

## 2020-12-31 MED ORDER — FUROSEMIDE 10 MG/ML IJ SOLN
40.0000 mg | Freq: Once | INTRAMUSCULAR | Status: AC
  Administered 2020-12-31: 21:00:00 40 mg via INTRAVENOUS
  Filled 2020-12-31: qty 4

## 2020-12-31 MED ORDER — HYDROMORPHONE HCL 1 MG/ML IJ SOLN
1.0000 mg | Freq: Once | INTRAMUSCULAR | Status: AC
  Administered 2020-12-31: 21:00:00 1 mg via INTRAVENOUS
  Filled 2020-12-31: qty 1

## 2020-12-31 MED ORDER — DILTIAZEM HCL-DEXTROSE 125-5 MG/125ML-% IV SOLN (PREMIX)
5.0000 mg/h | INTRAVENOUS | Status: DC
  Administered 2020-12-31: 21:00:00 5 mg/h via INTRAVENOUS
  Filled 2020-12-31: qty 125

## 2020-12-31 MED ORDER — DILTIAZEM LOAD VIA INFUSION
20.0000 mg | Freq: Once | INTRAVENOUS | Status: AC
  Administered 2020-12-31: 21:00:00 20 mg via INTRAVENOUS
  Filled 2020-12-31: qty 20

## 2020-12-31 MED ORDER — FUROSEMIDE 40 MG PO TABS: 40.0000 mg | ORAL_TABLET | Freq: Two times a day (BID) | ORAL | Status: DC

## 2020-12-31 NOTE — ED Notes (Signed)
Pt HR in 70s at this time. Still showing a-fib. EKG obtained and EDP made aware with order to back Cardizem to 5.

## 2020-12-31 NOTE — ED Provider Notes (Signed)
[facility] EMERGENCY DEPARTMENT Provider Note   CSN: [id number] Arrival date & time: 12/31/20  1831     History Chief Complaint  Patient presents with   Chest Pain    [name] is a 70 y.o. female.  HPI 70 year old female presents with chest pain, dyspnea, and palpitations.  Started around 3:30 PM.  She has had this many times before, typically once every 2 months or so.  However typically only last 45 minutes and today it did not get better.  She had a hard time even walking to get dressed and so she had to call EMS.  There is pressure and burning in her chest as well as palpitations and shortness of breath.  She has noticed some swelling in her legs for several weeks.  She chronically cannot lay flat but does not a new thing.  Right now she is actually feeling a lot better.  The pain in her chest is gone though she can still feel the palpitations.  Past Medical History:  Diagnosis Date   Arthritis    Hypertension    Lumbar disc herniation    hx. of. surgery 2'15    Patient Active Problem List   Diagnosis Date Noted   CHF (congestive heart failure) (HCC) 12/31/2020   Postoperative anemia due to acute blood loss 09/25/2013   S/P left THA, AA 09/24/2013   Spinal stenosis, lumbar region, with neurogenic claudication 04/04/2013   Morbid obesity (HCC) 04/04/2013    Past Surgical History:  Procedure Laterality Date   ABDOMINAL HYSTERECTOMY     HEMI-MICRODISCECTOMY LUMBAR LAMINECTOMY LEVEL 1 Left 04/04/2013   Procedure: HEMI-  LAMINECTOMY MICRODISCECTOMY OF L5 -S1 ON LEFT  ;  Surgeon: Jacki Cones, MD;  Location: [facility];  Service: Orthopedics;  Laterality: Left;   JOINT REPLACEMENT Right 2012   knee   JOINT REPLACEMENT Right 2010   hip   TOTAL HIP ARTHROPLASTY Left 09/24/2013   Procedure: LEFT TOTAL HIP ARTHROPLASTY ANTERIOR APPROACH;  Surgeon: Shelda Pal, MD;  Location: [facility];  Service: Orthopedics;  Laterality: Left;     OB History   No obstetric history  on file.     History reviewed. No pertinent family history.  Social History   Tobacco Use   Smoking status: Never   Smokeless tobacco: Never  Vaping Use   Vaping Use: Never used  Substance Use Topics   Alcohol use: No   Drug use: No    Home Medications Prior to Admission medications   Medication Sig Start Date End Date Taking? Authorizing Provider  ibuprofen (ADVIL) 200 MG tablet Take 400 mg by mouth every 6 (six) hours as needed.   Yes [provider]  acyclovir ointment (ZOVIRAX) 5 % Apply topically 3 (three) times daily. Patient not taking: Reported on 12/31/2020 12/22/20   [provider]  aspirin EC 325 MG EC tablet Take 1 tablet (325 mg total) by mouth 2 (two) times daily. Patient not taking: Reported on 12/31/2020 09/25/13   Lanney Gins, PA-C  docusate sodium 100 MG CAPS Take 100 mg by mouth 2 (two) times daily. Patient not taking: Reported on 12/31/2020 09/25/13   Lanney Gins, PA-C  ferrous sulfate 325 (65 FE) MG tablet Take 1 tablet (325 mg total) by mouth 3 (three) times daily after meals. Patient not taking: Reported on 12/31/2020 09/25/13   Lanney Gins, PA-C  HYDROcodone-acetaminophen (NORCO) 7.5-325 MG per tablet Take 1-2 tablets by mouth every 4 (four) hours as needed  for moderate pain. Patient not taking: Reported on 12/31/2020 09/25/13   Danae Orleans, PA-C  lisinopril-hydrochlorothiazide (PRINZIDE,ZESTORETIC) 10-12.5 MG per tablet Take 2 tablets by mouth every morning.  Patient not taking: Reported on 12/31/2020    [provider]  methocarbamol (ROBAXIN) 500 MG tablet Take 1 tablet (500 mg total) by mouth every 6 (six) hours as needed for muscle spasms. Patient not taking: Reported on 12/31/2020 09/25/13   Danae Orleans, PA-C  polyethylene glycol (MIRALAX / Floria Raveling) packet Take 17 g by mouth 2 (two) times daily. Patient not taking: Reported on 12/31/2020 09/25/13   Danae Orleans, PA-C    Allergies    Patient has no  known allergies.  Review of Systems   Review of Systems  Respiratory:  Positive for shortness of breath.   Cardiovascular:  Positive for chest pain, palpitations and leg swelling.  All other systems reviewed and are negative.  Physical Exam Updated Vital Signs BP 119/63   Pulse 72   Temp 98.6 F (37 C) (Oral)   Resp 15   Ht 5\' 5"  (1.651 m)   Wt 108.9 kg   SpO2 91%   BMI 39.95 kg/m   Physical Exam Vitals and nursing note reviewed.  Constitutional:      Appearance: She is well-developed. She is obese.  HENT:     Head: Normocephalic and atraumatic.     Right Ear: External ear normal.     Left Ear: External ear normal.     Nose: Nose normal.  Eyes:     General:        Right eye: No discharge.        Left eye: No discharge.  Cardiovascular:     Rate and Rhythm: Tachycardia present. Rhythm irregular.     Heart sounds: Normal heart sounds.  Pulmonary:     Effort: Pulmonary effort is normal.     Breath sounds: Normal breath sounds.  Abdominal:     Palpations: Abdomen is soft.     Tenderness: There is no abdominal tenderness.  Musculoskeletal:     Right lower leg: Edema present.     Left lower leg: Edema present.     Comments: Non-pitting ankle swelling  Skin:    General: Skin is warm and dry.  Neurological:     Mental Status: She is alert.  Psychiatric:        Mood and Affect: Mood is not anxious.    ED Results / Procedures / Treatments   Labs (all labs ordered are listed, but only abnormal results are displayed) Labs Reviewed  BASIC METABOLIC PANEL - Abnormal; Notable for the following components:      Result Value   Glucose, Bld 251 (*)    Calcium 8.8 (*)    All other components within normal limits  CBC - Abnormal; Notable for the following components:   WBC 10.7 (*)    All other components within normal limits  BRAIN NATRIURETIC PEPTIDE - Abnormal; Notable for the following components:   B Natriuretic Peptide 351.0 (*)    All other components within  normal limits  TROPONIN I (HIGH SENSITIVITY) - Abnormal; Notable for the following components:   Troponin I (High Sensitivity) 30 (*)    All other components within normal limits  TROPONIN I (HIGH SENSITIVITY) - Abnormal; Notable for the following components:   Troponin I (High Sensitivity) 58 (*)    All other components within normal limits  RESP PANEL BY RT-PCR (FLU A&B, COVID) ARPGX2  COMPREHENSIVE METABOLIC  PANEL  CBC  PROTIME-INR  APTT  MAGNESIUM  PHOSPHORUS    EKG EKG Interpretation  Date/Time:  Thursday December 31 2020 18:51:06 EST Ventricular Rate:  140 PR Interval:    QRS Duration: 90 QT Interval:  288 QTC Calculation: 440 R Axis:   -28 Text Interpretation: Atrial fibrillation with rapid V-rate Ventricular premature complex LVH with secondary repolarization abnormality Confirmed by Sherwood Gambler [phone]) on 12/31/2020 7:02:13 PM  Radiology DG Chest 2 View  Result Date: 12/31/2020 CLINICAL DATA:  Chest pain. EXAM: CHEST - 2 VIEW COMPARISON:  Chest radiograph dated 03/28/2013. FINDINGS: Cardiomegaly with vascular congestion. No focal consolidation, pleural effusion, pneumothorax. No acute osseous pathology. Degenerative changes of the spine. IMPRESSION: Cardiomegaly with vascular congestion. No focal consolidation. Electronically Signed   By: Anner Crete M.D.   On: 12/31/2020 19:36    Procedures .Critical Care Performed by: Sherwood Gambler, MD Authorized by: Sherwood Gambler, MD   Critical care provider statement:    Critical care time (minutes):  35   Critical care time was exclusive of:  Separately billable procedures and treating other patients   Critical care was necessary to treat or prevent imminent or life-threatening deterioration of the following conditions:  Cardiac failure   Critical care was time spent personally by me on the following activities:  Development of treatment plan with patient or surrogate, discussions with consultants, evaluation of  patient's response to treatment, examination of patient, ordering and review of laboratory studies, ordering and review of radiographic studies, ordering and performing treatments and interventions, pulse oximetry, re-evaluation of patient's condition and review of old charts   Medications Ordered in ED Medications  diltiazem (CARDIZEM) 1 mg/mL load via infusion 20 mg (20 mg Intravenous Bolus from Bag 12/31/20 2044)    And  diltiazem (CARDIZEM) 125 mg in dextrose 5% 125 mL (1 mg/mL) infusion (5 mg/hr Intravenous Rate/Dose Change 12/31/20 2155)  acetaminophen (TYLENOL) tablet 650 mg (has no administration in time range)  furosemide (LASIX) tablet 40 mg (has no administration in time range)  enoxaparin (LOVENOX) injection 40 mg (has no administration in time range)  diltiazem (CARDIZEM) injection 20 mg (20 mg Intravenous Given 12/31/20 1941)  furosemide (LASIX) injection 40 mg (40 mg Intravenous Given 12/31/20 2043)  aspirin chewable tablet 324 mg (324 mg Oral Given 12/31/20 2033)  HYDROmorphone (DILAUDID) injection 1 mg (1 mg Intravenous Given 12/31/20 2107)    ED Course  I have reviewed the triage vital signs and the nursing notes.  Pertinent labs & imaging results that were available during my care of the patient were reviewed by me and considered in my medical decision making (see chart for details).    MDM Rules/Calculators/A&P                           Patient is currently in A. fib with RVR.  She was given IV Cardizem.  While her symptoms started all of a sudden this afternoon and typically I would consider her for ED cardioversion, she has been having on and off palpitations and presentation similar to this for almost a year though they typically resolve on their own.  Given this I think would be a better/more conservative approach to treat her medically and observe and consider echo/anticoagulation.  While on the Cardizem she actually spontaneously converted.  However she is also  presenting with what appears to be some new, mild CHF.  She was given IV Lasix.  I think she will  need admission for further monitoring and cardiology consult. Final Clinical Impression(s) / ED Diagnoses Final diagnoses:  Atrial fibrillation with RVR (HCC)  Acute congestive heart failure, unspecified heart failure type [facility])    Rx / DC Orders ED Discharge Orders     None        Sherwood Gambler, MD 12/31/20 2348

## 2020-12-31 NOTE — ED Notes (Signed)
EDP aware of patients heart rate. EDP waiting for chest xray before giving orders. Will continue to monitor.

## 2020-12-31 NOTE — ED Triage Notes (Signed)
Pt to the ED with chest pain and possible new onset of afib.  Pt states her pain resolved in route to the ED.  Heart rate has been reported in the 120-130's by EMS.

## 2020-12-31 NOTE — ED Notes (Signed)
Cardizem given. Pt states that she can feel that her heart rate is fast but that she can tell its better. Pt denies having blurry vision, SOB or dizziness. BP reassessed. Will continue to monitor.

## 2021-01-01 ENCOUNTER — Other Ambulatory Visit (HOSPITAL_COMMUNITY): Payer: Self-pay

## 2021-01-01 ENCOUNTER — Other Ambulatory Visit (HOSPITAL_COMMUNITY): Payer: Self-pay | Admitting: *Deleted

## 2021-01-01 ENCOUNTER — Inpatient Hospital Stay (HOSPITAL_BASED_OUTPATIENT_CLINIC_OR_DEPARTMENT_OTHER): Payer: Medicare Other

## 2021-01-01 DIAGNOSIS — I480 Paroxysmal atrial fibrillation: Secondary | ICD-10-CM

## 2021-01-01 DIAGNOSIS — R7989 Other specified abnormal findings of blood chemistry: Secondary | ICD-10-CM

## 2021-01-01 DIAGNOSIS — R778 Other specified abnormalities of plasma proteins: Secondary | ICD-10-CM | POA: Diagnosis not present

## 2021-01-01 DIAGNOSIS — I509 Heart failure, unspecified: Secondary | ICD-10-CM | POA: Diagnosis not present

## 2021-01-01 DIAGNOSIS — I4891 Unspecified atrial fibrillation: Secondary | ICD-10-CM

## 2021-01-01 DIAGNOSIS — I5089 Other heart failure: Secondary | ICD-10-CM | POA: Diagnosis not present

## 2021-01-01 DIAGNOSIS — E669 Obesity, unspecified: Secondary | ICD-10-CM

## 2021-01-01 DIAGNOSIS — R739 Hyperglycemia, unspecified: Secondary | ICD-10-CM

## 2021-01-01 LAB — CBC
HCT: 47 % — ABNORMAL HIGH (ref 36.0–46.0)
Hemoglobin: 15.2 g/dL — ABNORMAL HIGH (ref 12.0–15.0)
MCH: 28.1 pg (ref 26.0–34.0)
MCHC: 32.3 g/dL (ref 30.0–36.0)
MCV: 86.9 fL (ref 80.0–100.0)
Platelets: 255 10*3/uL (ref 150–400)
RBC: 5.41 MIL/uL — ABNORMAL HIGH (ref 3.87–5.11)
RDW: 14.6 % (ref 11.5–15.5)
WBC: 13.6 10*3/uL — ABNORMAL HIGH (ref 4.0–10.5)
nRBC: 0 % (ref 0.0–0.2)

## 2021-01-01 LAB — COMPREHENSIVE METABOLIC PANEL
ALT: 48 U/L — ABNORMAL HIGH (ref 0–44)
AST: 51 U/L — ABNORMAL HIGH (ref 15–41)
Albumin: 3.9 g/dL (ref 3.5–5.0)
Alkaline Phosphatase: 80 U/L (ref 38–126)
Anion gap: 12 (ref 5–15)
BUN: 17 mg/dL (ref 8–23)
CO2: 21 mmol/L — ABNORMAL LOW (ref 22–32)
Calcium: 9 mg/dL (ref 8.9–10.3)
Chloride: 100 mmol/L (ref 98–111)
Creatinine, Ser: 0.77 mg/dL (ref 0.44–1.00)
GFR, Estimated: >60 mL/min (ref >60)
Glucose, Bld: 298 mg/dL — ABNORMAL HIGH (ref 70–99)
Potassium: 4 mmol/L (ref 3.5–5.1)
Sodium: 133 mmol/L — ABNORMAL LOW (ref 135–145)
Total Bilirubin: 0.7 mg/dL (ref 0.3–1.2)
Total Protein: 7.8 g/dL (ref 6.5–8.1)

## 2021-01-01 LAB — ECHOCARDIOGRAM COMPLETE
AV Mean grad: 9 mmHg
AV Peak grad: 17.8 mmHg
Ao pk vel: 2.11 m/s
Area-P 1/2: 2.95 cm2
Height: 65 in
Weight: 3841.3 oz

## 2021-01-01 LAB — PHOSPHORUS: Phosphorus: 3.3 mg/dL (ref 2.5–4.6)

## 2021-01-01 LAB — MAGNESIUM: Magnesium: 1.9 mg/dL (ref 1.7–2.4)

## 2021-01-01 LAB — HEMOGLOBIN A1C
Hgb A1c MFr Bld: 7.4 % — ABNORMAL HIGH (ref 4.8–5.6)
Mean Plasma Glucose: 165.68 mg/dL

## 2021-01-01 LAB — PROTIME-INR
INR: 1.1 (ref 0.8–1.2)
Prothrombin Time: 14.1 seconds (ref 11.4–15.2)

## 2021-01-01 LAB — TROPONIN I (HIGH SENSITIVITY)
Troponin I (High Sensitivity): 36 ng/L — ABNORMAL HIGH (ref <18)
Troponin I (High Sensitivity): 37 ng/L — ABNORMAL HIGH (ref <18)

## 2021-01-01 LAB — APTT: aPTT: 34 seconds (ref 24–36)

## 2021-01-01 LAB — TSH: TSH: 10.791 u[IU]/mL — ABNORMAL HIGH (ref 0.350–4.500)

## 2021-01-01 MED ORDER — METOPROLOL TARTRATE 25 MG PO TABS
25.0000 mg | ORAL_TABLET | Freq: Two times a day (BID) | ORAL | Status: DC
  Administered 2021-01-01: 25 mg via ORAL
  Filled 2021-01-01: qty 1

## 2021-01-01 MED ORDER — APIXABAN 5 MG PO TABS
5.0000 mg | ORAL_TABLET | Freq: Two times a day (BID) | ORAL | Status: DC
  Administered 2021-01-01: 5 mg via ORAL
  Filled 2021-01-01: qty 1

## 2021-01-01 MED ORDER — METOPROLOL TARTRATE 25 MG PO TABS: 25.0000 mg | ORAL_TABLET | Freq: Two times a day (BID) | ORAL | 2 refills | Status: DC

## 2021-01-01 MED ORDER — LIVING BETTER WITH HEART FAILURE BOOK: Freq: Once | Status: AC

## 2021-01-01 MED ORDER — FUROSEMIDE 10 MG/ML IJ SOLN
40.0000 mg | Freq: Two times a day (BID) | INTRAMUSCULAR | Status: DC
  Administered 2021-01-01: 40 mg via INTRAVENOUS
  Filled 2021-01-01: qty 4

## 2021-01-01 MED ORDER — ACETAMINOPHEN 325 MG PO TABS: 650.0000 mg | ORAL_TABLET | Freq: Four times a day (QID) | ORAL | Status: DC | PRN

## 2021-01-01 MED ORDER — PERFLUTREN LIPID MICROSPHERE
1.0000 mL | INTRAVENOUS | Status: AC | PRN
  Administered 2021-01-01: 2 mL via INTRAVENOUS
  Filled 2021-01-01: qty 10

## 2021-01-01 MED ORDER — CYCLOBENZAPRINE HCL 10 MG PO TABS
5.0000 mg | ORAL_TABLET | Freq: Three times a day (TID) | ORAL | Status: DC | PRN
  Administered 2021-01-01 (×2): 5 mg via ORAL
  Filled 2021-01-01 (×2): qty 1

## 2021-01-01 MED ORDER — FUROSEMIDE 10 MG/ML IJ SOLN
40.0000 mg | Freq: Two times a day (BID) | INTRAMUSCULAR | Status: DC
  Filled 2021-01-01: qty 4

## 2021-01-01 MED ORDER — FUROSEMIDE 20 MG PO TABS: 20.0000 mg | ORAL_TABLET | Freq: Every day | ORAL | 11 refills | Status: DC

## 2021-01-01 MED ORDER — APIXABAN 5 MG PO TABS: 5.0000 mg | ORAL_TABLET | Freq: Two times a day (BID) | ORAL | 3 refills | Status: DC

## 2021-01-01 NOTE — ED Notes (Signed)
Pt sitting in chair. Pt in NSR with a rate of 69-70. Hospitalist notified

## 2021-01-01 NOTE — Care Management CC44 (Signed)
Condition Code 44 Documentation Completed  Patient Details  Name: [name] MRN: [id number] Date of Birth: 04-11-1950   Condition Code 44 given:  Yes Patient signature on Condition Code 44 notice:  Yes Documentation of 2 MD's agreement:  Yes Code 44 added to claim:  Yes    Elliot Gault, LCSW 01/01/2021, 3:14 PM

## 2021-01-01 NOTE — Care Management Obs Status (Signed)
MEDICARE OBSERVATION STATUS NOTIFICATION   Patient Details  Name: [name] MRN: [id number] Date of Birth: Apr 20, 1950   Medicare Observation Status Notification Given:  Yes    Elliot Gault, LCSW 01/01/2021, 3:14 PM

## 2021-01-01 NOTE — Consult Note (Addendum)
Cardiology Consultation:   Patient ID: VERNON GRENDA MRN: [id number]; DOB: 1950-03-12  Admit date: 12/31/2020 Date of Consult: 01/01/2021  PCP:  [facility]   [facility] Providers Cardiologist: New to [facility] - Dr. Domenic Polite  Patient Profile:   [name] is a 70 y.o. female with a past medical history of HTN and OA who is being seen 01/01/2021 for the evaluation of CHF and atrial fibrillation with RVR at the request of Dr. Josephine Cables.  History of Present Illness:   [name] presented to [facility] ED on 12/31/2020 for evaluation of chest pain and tachycardia. In talking with the patient today, she reports having intermittent episodes of palpitations over the past year but says they only occur every 2 to 3 months and last for 30 to 45 minutes at a time and spontaneously resolve.  Yesterday, she developed palpitations around 1500 and symptoms persisted which prompted her to come to the ED for evaluation. She reports discomfort in her chest at that time along with palpitations. Denies any specific dyspnea on exertion, dizziness or presyncope. She does have baseline two-pillow orthopnea but denies any recent PND. She has been told by family members that she does snore but has never undergone evaluation for sleep apnea. She does consume approximately 2 cups of coffee a day but denies any alcohol use. She is unaware of any medical history regarding family members.  Initial labs show WBC 10.7, Hgb 14.5, platelets 217, Na+ 135, K+ 3.5 and creatinine 0.74. BNP 351. COVID negative. Initial Hs Troponin 30 with repeat values of 58, 36 and 37. CXR showing cardiomegaly with vascular congestion. EKG showing atrial fibrillation with RVR, HR 140 with LVH and associated repol abnornmalities along the lateral leads.   She received IV Lasix 40mg  while in the ED and was also started on IV Cardizem for rate-control and converted back to NSR.  She has been in normal sinus rhythm overnight  and this morning with heart rate in the 70's to 80's.   Past Medical History:  Diagnosis Date   Arthritis    Hypertension    Lumbar disc herniation    hx. of. surgery 2'15    Past Surgical History:  Procedure Laterality Date   ABDOMINAL HYSTERECTOMY     HEMI-MICRODISCECTOMY LUMBAR LAMINECTOMY LEVEL 1 Left 04/04/2013   Procedure: HEMI-  LAMINECTOMY MICRODISCECTOMY OF L5 -S1 ON LEFT  ;  Surgeon: Tobi Bastos, MD;  Location: [facility];  Service: Orthopedics;  Laterality: Left;   JOINT REPLACEMENT Right 2012   knee   JOINT REPLACEMENT Right 2010   hip   TOTAL HIP ARTHROPLASTY Left 09/24/2013   Procedure: LEFT TOTAL HIP ARTHROPLASTY ANTERIOR APPROACH;  Surgeon: Mauri Pole, MD;  Location: [facility];  Service: Orthopedics;  Laterality: Left;     Home Medications:  Prior to Admission medications   Medication Sig Start Date End Date Taking? Authorizing Provider  ibuprofen (ADVIL) 200 MG tablet Take 400 mg by mouth every 6 (six) hours as needed.   Yes [provider]  acyclovir ointment (ZOVIRAX) 5 % Apply topically 3 (three) times daily. Patient not taking: Reported on 12/31/2020 12/22/20   [provider]  aspirin EC 325 MG EC tablet Take 1 tablet (325 mg total) by mouth 2 (two) times daily. Patient not taking: Reported on 12/31/2020 09/25/13   Danae Orleans, PA-C  docusate sodium 100 MG CAPS Take 100 mg by mouth 2 (two) times daily. Patient not taking: Reported on 12/31/2020 09/25/13  Lanney Gins, PA-C  ferrous sulfate 325 (65 FE) MG tablet Take 1 tablet (325 mg total) by mouth 3 (three) times daily after meals. Patient not taking: Reported on 12/31/2020 09/25/13   Lanney Gins, PA-C  HYDROcodone-acetaminophen (NORCO) 7.5-325 MG per tablet Take 1-2 tablets by mouth every 4 (four) hours as needed for moderate pain. Patient not taking: Reported on 12/31/2020 09/25/13   Lanney Gins, PA-C  lisinopril-hydrochlorothiazide (PRINZIDE,ZESTORETIC) 10-12.5 MG per tablet  Take 2 tablets by mouth every morning.  Patient not taking: Reported on 12/31/2020    [provider]  methocarbamol (ROBAXIN) 500 MG tablet Take 1 tablet (500 mg total) by mouth every 6 (six) hours as needed for muscle spasms. Patient not taking: Reported on 12/31/2020 09/25/13   Lanney Gins, PA-C  polyethylene glycol (MIRALAX / Ethelene Hal) packet Take 17 g by mouth 2 (two) times daily. Patient not taking: Reported on 12/31/2020 09/25/13   Lanney Gins, PA-C    Inpatient Medications: Scheduled Meds:  apixaban  5 mg Oral BID   furosemide  40 mg Intravenous Q12H   metoprolol tartrate  25 mg Oral BID   Continuous Infusions:   PRN Meds: acetaminophen, cyclobenzaprine  Allergies:   No Known Allergies  Social History:   Social History   Tobacco Use   Smoking status: Never   Smokeless tobacco: Never  Substance Use Topics   Alcohol use: No     Family History:   History reviewed. Patient unaware of any family history.   ROS:  Please see the history of present illness.   All other ROS reviewed and negative.     Physical Exam/Data:   Vitals:   01/01/21 0220 01/01/21 0430 01/01/21 0640 01/01/21 0811  BP: (!) 121/59 124/63 119/72 (!) 150/63  Pulse: 70 72 73 88  Resp: 18 18 19 20   Temp:    97.9 F (36.6 C)  TempSrc:    Oral  SpO2: 93% 96% 100% 98%  Weight:      Height:       No intake or output data in the 24 hours ending 01/01/21 1035 Last 3 Weights 12/31/2020 09/24/2013 09/24/2013  Weight (lbs) 240 lb 1.3 oz 240 lb 240 lb 4 oz  Weight (kg) 108.9 kg 108.863 kg 108.977 kg     Body mass index is 39.95 kg/m.  General: Obese female appearing in no acute distress HEENT: normal Neck: JVD difficult to assess secondary to body habitus.  Vascular: No carotid bruits; Distal pulses 2+ bilaterally Cardiac:  normal S1, S2; RRR; no murmur. Lungs:  clear to auscultation bilaterally, no wheezing, rhonchi or rales  Abd: soft, nontender, no hepatomegaly  Ext: 1+  pitting edema bilaterally.  Musculoskeletal:  No deformities, BUE and BLE strength normal and equal Skin: warm and dry  Neuro:  CNs 2-12 intact, no focal abnormalities noted Psych:  Normal affect   EKG:  The EKG was personally reviewed and demonstrates: Atrial fibrillation with RVR, HR 140 with LVH and associated repol abnornmalities along the lateral leads.   Telemetry:  Telemetry was personally reviewed and demonstrates: NSR, HR in 70's to 80's.   Relevant CV Studies:  Echocardiogram: Pending  Laboratory Data:  High Sensitivity Troponin:   Recent Labs  Lab 12/31/20 1853 12/31/20 2125 01/01/21 0257 01/01/21 0431  TROPONINIHS 30* 58* 36* 37*     Chemistry Recent Labs  Lab 12/31/20 1853 01/01/21 0257  NA 135 133*  K 3.5 4.0  CL 102 100  CO2 23 21*  GLUCOSE 251* 298*  BUN 15 17  CREATININE 0.74 0.77  CALCIUM 8.8* 9.0  MG  --  1.9  GFRNONAA >60 >60  ANIONGAP 10 12    Recent Labs  Lab 01/01/21 0257  PROT 7.8  ALBUMIN 3.9  AST 51*  ALT 48*  ALKPHOS 80  BILITOT 0.7    Hematology Recent Labs  Lab 12/31/20 1853 01/01/21 0257  WBC 10.7* 13.6*  RBC 5.02 5.41*  HGB 14.5 15.2*  HCT 42.9 47.0*  MCV 85.5 86.9  MCH 28.9 28.1  MCHC 33.8 32.3  RDW 14.4 14.6  PLT 217 255    BNP Recent Labs  Lab 12/31/20 1853  BNP 351.0*     Radiology/Studies:  DG Chest 2 View  Result Date: 12/31/2020 CLINICAL DATA:  Chest pain. EXAM: CHEST - 2 VIEW COMPARISON:  Chest radiograph dated 03/28/2013. FINDINGS: Cardiomegaly with vascular congestion. No focal consolidation, pleural effusion, pneumothorax. No acute osseous pathology. Degenerative changes of the spine. IMPRESSION: Cardiomegaly with vascular congestion. No focal consolidation. Electronically Signed   By: Anner Crete M.D.   On: 12/31/2020 19:36     Assessment and Plan:   1. New-Onset Atrial Fibrillation - This is a new diagnosis for the patient this admission but she does report intermittent palpitations  within the past year which would spontaneously resolve and suspicious for atrial fibrillation at that time. - Electrolytes are within a normal range. Will add on TSH to previously drawn AM labs. Echocardiogram is pending to assess for any structural abnormalities. - She is now off Cardizem and will plan to start Lopressor 25 mg twice daily. This can be titrated as an outpatient if needed. - Given her elevated CHA2DS2-VASc score of at least 4, will start Eliquis 5 mg twice daily for anticoagulation and this was reviewed with the patient today. Will consult Case Management for 30-day card.   2. Acute CHF Exacerbation - BNP was elevated to 351 on admission and CXR showed vascular congestion. She does have edema on examination today and has been started on IV Lasix 40 mg twice daily but I&O's are not yet recorded. Her breathing is now back to baseline and would anticipate she can likely transition from HCTZ to Lasix 20 mg daily at discharge with close follow-up BMET in 7-10 days.   3. Elevated Troponin Values - Hs Troponin values have been flat at 30, 58, 36 and 37. Suspect this is secondary to demand ischemia in the setting of her tachycardia. An echocardiogram is pending to assess LV function and wall motion. Unless her echocardiogram shows significant abnormalities, would not anticipate ischemic evaluation this admission  4. Sleep-Disordered Breathing - She does report snoring and daytime somnolence. Given her atrial fibrillation and body habitus, she would benefit from a sleep study as an outpatient    Risk Assessment/Risk Scores:    CHA2DS2-VASc Score = 4   This indicates a 4.8% annual risk of stroke. The patient's score is based upon: CHF History: 0 HTN History: 1 Diabetes History: 1 Stroke History: 0 Vascular Disease History: 0 Age Score: 1 Gender Score: 1    For questions or updates, please contact [place] Please consult [email or web address] for contact info under     Signed, Erma Heritage, PA-C  01/01/2021 10:35 AM    Attending note:  Patient seen and examined.  I reviewed her records and discussed the case with Ms. Ahmed Prima PA-, I agree with her above findings.  [name] presents for evaluation due to recent onset palpitations and chest discomfort.  She states that she has been feeling brief, intermittent palpitations over the last year, but symptoms persisted prompting her to seek medical attention.  She was noted to be in rapid atrial fibrillation on evaluation in the [facility] ER, with intravenous diltiazem she converted spontaneously to sinus rhythm where she remains this morning.  She states that she feels better today.  On examination she appears comfortable.  She is afebrile.  Heart rate is in the 80s in sinus rhythm by telemetry.  Systolic blood pressure ranging from 120-150.  Lungs are clear with decreased breath sounds.  Cardiac exam reveals RRR without gallop.  She has mild lower extremity edema/lymphedema.  Pertinent lab work includes potassium 4.0, BUN 17, creatinine 0.77, AST 51, ALT 48, high-sensitivity troponin I levels flat in the 30s to 50s not suggestive of ACS, BNP 351, WBC 13.6, hemoglobin 15.2, platelets 255, influenza A and COVID-19 negative.  Chest x-ray reports cardiomegaly with vascular congestion, no infiltrates.  I personally reviewed her ECG which shows sinus rhythm with LVH and repolarization abnormalities, leftward axis.  Newly documented atrial fibrillation with suspected PAF based on description of intermittent palpitations over the last year.  CHA2DS2-VASc score is at least 4.  She has spontaneously converted to sinus rhythm.  Cardiac enzymes do not suggest ACS.  She does describe history of snoring and some daytime somnolence so would suspect OSA as well.  Follow-up echocardiogram, unless there are major structural abnormalities would plan to start low-dose Lopressor, also Eliquis for stroke prophylaxis.  Would  anticipate further work-up as an outpatient including sleep study.  Satira Sark, M.D., F.A.C.C.

## 2021-01-01 NOTE — ED Notes (Addendum)
Went in pt rooms and she is stating that she is ready to leave because her legs are cramping and she is not able to walk around here. States "can I just get this echo another time". Tried to explain and reason with patient about the extent of her condition but she and husband are oblivious. Told pt I would contact the hospitalist about her wanting to leave. Adef[place]o made aware and to talk with patient.  This is the second time patient has talked about leaving.

## 2021-01-01 NOTE — H&P (Addendum)
History and Physical  [name] NWG:[id number] DOB: [birth year] DOA: 12/31/2020  Referring physician: Pricilla Loveless, MD PCP: [facility]  Patient coming from: Home  Chief Complaint: Palpitations  HPI: [name] is a 70 y.o. obese female with medical history significant for essential hypertension, arthritis, lumbar disc herniation who presents to the emergency department due to palpitations which started around 3:30 PM, this was associated with chest pain.  She has had similar palpitations in the past and usually alcohol about every 2 months but typically resolve within 45 minutes.  Patient was concerned when the palpitations last longer, she states that she had difficulty walking to get dressed while having the palpitations, so EMS was activated.  This was associated with burning and pressure-like sensation and some shortness of breath.  Patient also complained of several weeks of some leg swelling and complaining of some cramps in the legs, she endorsed not being able to lay flat chronically.  Chest pain has since resolved, but palpitations was ongoing by the time she arrived at the ED.  ED Course:  In the emergency department, she was initially tachypneic and tachycardic.  BP was 131/78 and other vital signs were within normal range.  Work-up in the ED showed normal CBC except for slight elevation in WBC at 10.7.  BMP was normal except for hyperglycemia.  Troponin x2- 30 > 58, BNP 351.  Influenza A, B, SARS coronavirus 2 was negative. Chest x-ray showed cardiomegaly with vascular congestion.  No focal consolidation. Aspirin was given, patient was started on IV Cardizem drip, IV Lasix 40 mg x 1 was given.   IV Dilaudid was given.  Hospitalist was asked to admit patient for further evaluation and management.  Review of Systems: Constitutional: Negative for chills and fever.  HENT: Negative for ear pain and sore throat.   Eyes: Negative for pain and visual  disturbance.  Respiratory: Positive for shortness of breath.  Negative for cough, chest tightness   Cardiovascular: Positive for chest pain, palpitations and leg swelling. Gastrointestinal: Negative for abdominal pain and vomiting.  Endocrine: Negative for polyphagia and polyuria.  Genitourinary: Negative for decreased urine volume, dysuria, enuresis Musculoskeletal: Negative for arthralgias and back pain.  Skin: Negative for color change and rash.  Allergic/Immunologic: Negative for immunocompromised state.  Neurological: Negative for tremors, syncope, speech difficulty, light-headedness and headaches.  Hematological: Does not bruise/bleed easily.  All other systems reviewed and are negative   Past Medical History:  Diagnosis Date   Arthritis    Hypertension    Lumbar disc herniation    hx. of. surgery 2'15   Past Surgical History:  Procedure Laterality Date   ABDOMINAL HYSTERECTOMY     HEMI-MICRODISCECTOMY LUMBAR LAMINECTOMY LEVEL 1 Left 04/04/2013   Procedure: HEMI-  LAMINECTOMY MICRODISCECTOMY OF L5 -S1 ON LEFT  ;  Surgeon: Jacki Cones, MD;  Location: [facility];  Service: Orthopedics;  Laterality: Left;   JOINT REPLACEMENT Right 2012   knee   JOINT REPLACEMENT Right 2010   hip   TOTAL HIP ARTHROPLASTY Left 09/24/2013   Procedure: LEFT TOTAL HIP ARTHROPLASTY ANTERIOR APPROACH;  Surgeon: Shelda Pal, MD;  Location: [facility];  Service: Orthopedics;  Laterality: Left;    Social History:  reports that she has never smoked. She has never used smokeless tobacco. She reports that she does not drink alcohol and does not use drugs.   No Known Allergies  History reviewed. No pertinent family history.   Prior to Admission medications  Medication Sig Start Date End Date Taking? Authorizing Provider  ibuprofen (ADVIL) 200 MG tablet Take 400 mg by mouth every 6 (six) hours as needed.   Yes [provider]  acyclovir ointment (ZOVIRAX) 5 % Apply topically 3 (three) times  daily. Patient not taking: Reported on 12/31/2020 12/22/20   [provider]  aspirin EC 325 MG EC tablet Take 1 tablet (325 mg total) by mouth 2 (two) times daily. Patient not taking: Reported on 12/31/2020 09/25/13   Danae Orleans, PA-C  docusate sodium 100 MG CAPS Take 100 mg by mouth 2 (two) times daily. Patient not taking: Reported on 12/31/2020 09/25/13   Danae Orleans, PA-C  ferrous sulfate 325 (65 FE) MG tablet Take 1 tablet (325 mg total) by mouth 3 (three) times daily after meals. Patient not taking: Reported on 12/31/2020 09/25/13   Danae Orleans, PA-C  HYDROcodone-acetaminophen (NORCO) 7.5-325 MG per tablet Take 1-2 tablets by mouth every 4 (four) hours as needed for moderate pain. Patient not taking: Reported on 12/31/2020 09/25/13   Danae Orleans, PA-C  lisinopril-hydrochlorothiazide (PRINZIDE,ZESTORETIC) 10-12.5 MG per tablet Take 2 tablets by mouth every morning.  Patient not taking: Reported on 12/31/2020    [provider]  methocarbamol (ROBAXIN) 500 MG tablet Take 1 tablet (500 mg total) by mouth every 6 (six) hours as needed for muscle spasms. Patient not taking: Reported on 12/31/2020 09/25/13   Danae Orleans, PA-C  polyethylene glycol (MIRALAX / Floria Raveling) packet Take 17 g by mouth 2 (two) times daily. Patient not taking: Reported on 12/31/2020 09/25/13   Danae Orleans, PA-C    Physical Exam: BP (!) 121/59   Pulse 70   Temp 98.6 F (37 C) (Oral)   Resp 18   Ht 5\' 5"  (1.651 m)   Wt 108.9 kg   SpO2 93%   BMI 39.95 kg/m   General: 70 y.o. year-old obese female well developed well nourished in no acute distress.  Alert and oriented x3. HEENT: NCAT, EOMI Neck: Supple, trachea medial Cardiovascular: Regular rate and rhythm with no rubs or gallops.  No thyromegaly or JVD noted.  2/4 pulses in all 4 extremities. Respiratory: Bilateral rales in lower lobes on auscultation.  No wheezes  Abdomen: Soft, nontender nondistended with normal bowel  sounds x4 quadrants. Muskuloskeletal: No cyanosis, clubbing or edema noted bilaterally Neuro: CN II-XII intact, strength 5/5 x 4, sensation, reflexes intact Skin: No ulcerative lesions noted or rashes Psychiatry: Mood is appropriate for condition and setting          Labs on Admission:  Basic Metabolic Panel: Recent Labs  Lab 12/31/20 1853  NA 135  K 3.5  CL 102  CO2 23  GLUCOSE 251*  BUN 15  CREATININE 0.74  CALCIUM 8.8*   Liver Function Tests: No results for input(s): AST, ALT, ALKPHOS, BILITOT, PROT, ALBUMIN in the last 168 hours. No results for input(s): LIPASE, AMYLASE in the last 168 hours. No results for input(s): AMMONIA in the last 168 hours. CBC: Recent Labs  Lab 12/31/20 1853  WBC 10.7*  HGB 14.5  HCT 42.9  MCV 85.5  PLT 217   Cardiac Enzymes: No results for input(s): CKTOTAL, CKMB, CKMBINDEX, TROPONINI in the last 168 hours.  BNP (last 3 results) Recent Labs    12/31/20 1853  BNP 351.0*    ProBNP (last 3 results) No results for input(s): PROBNP in the last 8760 hours.  CBG: No results for input(s): GLUCAP in the last 168 hours.  Radiological Exams on Admission:  DG Chest 2 View  Result Date: 12/31/2020 CLINICAL DATA:  Chest pain. EXAM: CHEST - 2 VIEW COMPARISON:  Chest radiograph dated 03/28/2013. FINDINGS: Cardiomegaly with vascular congestion. No focal consolidation, pleural effusion, pneumothorax. No acute osseous pathology. Degenerative changes of the spine. IMPRESSION: Cardiomegaly with vascular congestion. No focal consolidation. Electronically Signed   By: Anner Crete M.D.   On: 12/31/2020 19:36    EKG: I independently viewed the EKG done and my findings are as followed:  Initial EKG on arrival: A. fib with RVR Subsequent EKG: Normal sinus rhythm at a rate of 70 bpm with short PR interval  Assessment/Plan Present on Admission:  Spinal stenosis, lumbar region, with neurogenic claudication  Principal Problem:   CHF (congestive  heart failure) ([place]) Active Problems:   Spinal stenosis, lumbar region, with neurogenic claudication   Paroxysmal atrial fibrillation with RVR ([facility])   Elevated troponin   Hyperglycemia   Obesity (BMI 30-39.9)   Elevated brain natriuretic peptide (BNP) level  Congestive heart failure Elevated BNP-351 Chest x-ray showed cardiomegaly with vascular congestion. Continue total input/output, daily weights and fluid restriction Continue IV Lasix 40 twice daily Continue Cardiac diet  Echocardiogram in the morning  Cardiology will be consulted and we shall await further recommendation  Paroxysmal A. fib with RVR- resolved Patient was placed on IV Cardizem drip and she converted to normal sinus rhythm.  Drip held at this time Continue telemetry and consider patient on rate control on oral DOAC if she goes back to A. fib prior to discharge Echocardiogram will be done in the morning  Elevated troponin possibly secondary to type II demand ischemia Troponin x2 -30 > 58.  This is possibly secondary to demand ischemia in the setting of palpitations related to A. fib with RVR. Continue to trend troponin  Hyperglycemia with no history of type II DM CBG 251, hemoglobin A1c will be checked  Spinal stenosis, lumbar region with neurogenic claudication Continue Tylenol as needed Continue Flexeril as needed for muscle spasm  Obesity (BMI 39.95) Diet and lifestyle intervention advised Patient will need to follow up with out-patient physician for weight loss program   DVT prophylaxis: Lovenox  Code Status: Full code  Family Communication: None at bedside  Disposition Plan:  Patient is from:                        home Anticipated DC to:                   SNF or family members home Anticipated DC date:               2-3 days Anticipated DC barriers:          Patient requires inpatient management due to possible new onset CHF pending cardiology consult    Consults called:  Cardiology  Admission status: Inpatient    Bernadette Hoit MD [facility]  01/01/2021, 2:33 AM

## 2021-01-01 NOTE — ED Notes (Signed)
Hospitalist spoke with patient and she agreed to stay. Flexeril giving for leg cramps. Lasix discontinued and next dose due at 8am this morning. Vitals stable. Will continue to monitor.

## 2021-01-01 NOTE — Progress Notes (Signed)
*  PRELIMINARY RESULTS* Echocardiogram 2D Echocardiogram has been performed.  Carolyne Fiscal 01/01/2021, 12:38 PM

## 2021-01-01 NOTE — Care Management Important Message (Signed)
Important Message  Patient Details  Name: [name] MRN: [id number] Date of Birth: 1950/07/22   Medicare Important Message Given:  Yes     Corey Harold 01/01/2021, 2:57 PM

## 2021-01-01 NOTE — Discharge Instructions (Signed)

## 2021-01-01 NOTE — Discharge Summary (Signed)
Physician Discharge Summary  [name] [id number] DOB: 10/15/1950 DOA: 12/31/2020  PCP: [facility]  Admit date: 12/31/2020  Discharge date: 01/01/2021  Admitted From:Home  Disposition:  Home  Recommendations for Outpatient Follow-up:  Follow up with PCP in 1-2 weeks Follow-up with cardiology on 12/22 as scheduled Continue on metoprolol and Eliquis as prescribed Lasix 20 mg daily with follow-up to bmet in 7-10 days Continue on other home medications as prior  Home Health: None  Equipment/Devices: None  Discharge Condition:Stable  CODE STATUS: Full  Diet recommendation: Heart Healthy  Brief/Interim Summary:  [name] is a 70 y.o. obese female with medical history significant for essential hypertension, arthritis, lumbar disc herniation who presents to the emergency department due to palpitations which started around 3:30 PM, this was associated with chest pain.  She has had similar palpitations in the past and usually alcohol about every 2 months but typically resolve within 45 minutes.  Patient was concerned when the palpitations last longer, she states that she had difficulty walking to get dressed while having the palpitations, so EMS was activated.  She was admitted with new onset atrial fibrillation and was initially on Cardizem drip which was weaned off overnight.  She was started on Lopressor 25 mg twice daily per cardiology and given her CHADS2-Vasc score of at least 4 she was started on Eliquis 5 mg twice daily for anticoagulation.  She has also been started on Lasix 20 mg daily.  Her 2D echocardiogram reveals LVEF of 60-65% and no other acute concerns were noted throughout the course of this admission.  She has remained asymptomatic otherwise with no other acute concerns or events noted throughout the course of this hospitalization.  She will follow-up with cardiology outpatient.  Discharge Diagnoses:  Principal Problem:   CHF (congestive  heart failure) (HCC) Active Problems:   Spinal stenosis, lumbar region, with neurogenic claudication   Paroxysmal atrial fibrillation with RVR (HCC)   Elevated troponin   Hyperglycemia   Obesity (BMI 30-39.9)   Elevated brain natriuretic peptide (BNP) level  Principal discharge diagnosis: New onset atrial fibrillation with associated acute diastolic CHF exacerbation.  Discharge Instructions  Discharge Instructions     Diet - low sodium heart healthy   Complete by: As directed    Increase activity slowly   Complete by: As directed       Allergies as of 01/01/2021   No Known Allergies      Medication List     STOP taking these medications    acyclovir ointment 5 % Commonly known as: ZOVIRAX   aspirin 325 MG EC tablet   HYDROcodone-acetaminophen 7.5-325 MG tablet Commonly known as: NORCO   lisinopril-hydrochlorothiazide 10-12.5 MG tablet Commonly known as: ZESTORETIC       TAKE these medications    apixaban 5 MG Tabs tablet Commonly known as: ELIQUIS Take 1 tablet (5 mg total) by mouth 2 (two) times daily.   DSS 100 MG Caps Take 100 mg by mouth 2 (two) times daily.   ferrous sulfate 325 (65 FE) MG tablet Take 1 tablet (325 mg total) by mouth 3 (three) times daily after meals.   ibuprofen 200 MG tablet Commonly known as: ADVIL Take 400 mg by mouth every 6 (six) hours as needed.   methocarbamol 500 MG tablet Commonly known as: ROBAXIN Take 1 tablet (500 mg total) by mouth every 6 (six) hours as needed for muscle spasms.   metoprolol tartrate 25 MG tablet Commonly known as: LOPRESSOR  Take 1 tablet (25 mg total) by mouth 2 (two) times daily.   polyethylene glycol 17 g packet Commonly known as: MIRALAX / GLYCOLAX Take 17 g by mouth 2 (two) times daily.        Follow-up Information     Erma Heritage, PA-C Follow up on 02/04/2021.   Specialties: Physician Assistant, Cardiology Why: Cardiology Hospital Follow-up on 02/04/2021 at 2:30  PM. Contact information: [place] [address] [phone]         [facility], [place]. Schedule an appointment as soon as possible for a visit in 1 week(s).   Specialty: Family Medicine Contact information: [place] [address] [phone]                No Known Allergies  Consultations: Cardiology   Procedures/Studies: DG Chest 2 View  Result Date: 12/31/2020 CLINICAL DATA:  Chest pain. EXAM: CHEST - 2 VIEW COMPARISON:  Chest radiograph dated 03/28/2013. FINDINGS: Cardiomegaly with vascular congestion. No focal consolidation, pleural effusion, pneumothorax. No acute osseous pathology. Degenerative changes of the spine. IMPRESSION: Cardiomegaly with vascular congestion. No focal consolidation. Electronically Signed   By: Anner Crete M.D.   On: 12/31/2020 19:36   ECHOCARDIOGRAM COMPLETE  Result Date: 01/01/2021    ECHOCARDIOGRAM REPORT   Patient Name:   [name] Date of Exam: 01/01/2021 Medical Rec #:  [id number]       Height:       65.0 in Accession #:    [id number]      Weight:       240.1 lb Date of Birth:  1950/06/16        BSA:          2.138 m Patient Age:    30 years        BP:           150/63 mmHg Patient Gender: F               HR:           88 bpm. Exam Location:  [facility] Procedure: 2D Echo, Cardiac Doppler, Color Doppler and Intracardiac            Opacification Agent Indications:    CHF  History:        Patient has no prior history of Echocardiogram examinations.                 CHF; Arrythmias:Atrial Fibrillation.  Sonographer:    Wenda Low Referring Phys: [id number] OLADAPO ADEFESO  Sonographer Comments: Patient is morbidly obese and Technically difficult study due to poor echo windows. Patient is unable to lay on left side and is sitting for the study. IMPRESSIONS  1. Images are limited.  2. Left ventricular ejection fraction, by estimation, is 60 to 65%. The left ventricle has normal  function. The left ventricle has no regional wall motion abnormalities. There is mild left ventricular hypertrophy. Left ventricular diastolic parameters are indeterminate.  3. Right ventricular systolic function is normal. The right ventricular size is normal. Tricuspid regurgitation signal is inadequate for assessing PA pressure.  4. Left atrial size was mildly dilated.  5. The mitral valve was not well visualized. Trivial mitral valve regurgitation. No evidence of mitral stenosis. The mean mitral valve gradient is 2.0 mmHg.  6. The aortic valve was not well visualized. Aortic valve regurgitation is not visualized. Aortic valve mean gradient measures 9.0 mmHg. Comparison(s): No prior Echocardiogram. FINDINGS  Left Ventricle: Left ventricular ejection fraction, by estimation, is 60 to 65%. The left ventricle has normal function. The left ventricle has no regional wall motion abnormalities. Definity contrast agent was given IV to delineate the left ventricular  endocardial borders. The left ventricular internal cavity size was normal in size. There is mild left ventricular hypertrophy. Left ventricular diastolic parameters are indeterminate. Right Ventricle: The right ventricular size is normal. No increase in right ventricular wall thickness. Right ventricular systolic function is normal. Tricuspid regurgitation signal is inadequate for assessing PA pressure. Left Atrium: Left atrial size was mildly dilated. Right Atrium: Right atrial size was normal in size. Pericardium: There is no evidence of pericardial effusion. Presence of epicardial fat layer. Mitral Valve: The mitral valve was not well visualized. Mild to moderate mitral annular calcification. Trivial mitral valve regurgitation. No evidence of mitral valve stenosis. MV peak gradient, 4.6 mmHg. The mean mitral valve gradient is 2.0 mmHg. Tricuspid Valve: The tricuspid valve is not well visualized. Tricuspid valve regurgitation is trivial. Aortic Valve: The  aortic valve was not well visualized. Aortic valve regurgitation is not visualized. Aortic valve mean gradient measures 9.0 mmHg. Aortic valve peak gradient measures 17.8 mmHg. Pulmonic Valve: The pulmonic valve was not assessed. Pulmonic valve regurgitation is not visualized. Aorta: The aortic root is normal in size and structure. IAS/Shunts: The interatrial septum was not assessed.   Diastology LV e' medial:   3.30 cm/s LV E/e' medial: 30.9  AORTIC VALVE AV Vmax:           211.00 cm/s AV Vmean:          134.000 cm/s AV VTI:            0.314 m AV Peak Grad:      17.8 mmHg AV Mean Grad:      9.0 mmHg LVOT Vmax:         148.00 cm/s LVOT Vmean:        97.800 cm/s LVOT VTI:          0.260 m LVOT/AV VTI ratio: 0.83 MITRAL VALVE MV Area (PHT): 2.95 cm     SHUNTS MV Peak grad:  4.6 mmHg     Systemic VTI: 0.26 m MV Mean grad:  2.0 mmHg MV Vmax:       1.07 m/s MV Vmean:      65.8 cm/s MV Decel Time: 257 msec MV E velocity: 102.00 cm/s MV A velocity: 96.80 cm/s MV E/A ratio:  1.05 Rozann Lesches MD Electronically signed by Rozann Lesches MD Signature Date/Time: 01/01/2021/12:53:22 PM    Final      Discharge Exam: Vitals:   01/01/21 0811 01/01/21 1239  BP: (!) 150/63 112/62  Pulse: 88 82  Resp: 20 18  Temp: 97.9 F (36.6 C) 98 F (36.7 C)  SpO2: 98% 98%   Vitals:   01/01/21 0430 01/01/21 0640 01/01/21 0811 01/01/21 1239  BP: 124/63 119/72 (!) 150/63 112/62  Pulse: 72 73 88 82  Resp: 18 19 20 18   Temp:   97.9 F (36.6 C) 98 F (36.7 C)  TempSrc:   Oral Oral  SpO2: 96% 100% 98% 98%  Weight:      Height:        General: Pt is alert, awake, not in acute distress, obesity Cardiovascular: RRR, S1/S2 +, no rubs, no gallops Respiratory: CTA bilaterally, no wheezing, no rhonchi Abdominal: Soft, NT, ND, bowel sounds + Extremities: no edema, no cyanosis    The results of significant diagnostics  from this hospitalization (including imaging, microbiology, ancillary and laboratory) are listed below  for reference.     Microbiology: Recent Results (from the past 240 hour(s))  Resp Panel by RT-PCR (Flu A&B, Covid) Nasopharyngeal Swab     Status: None   Collection Time: 12/31/20  8:19 PM   Specimen: Nasopharyngeal Swab; Nasopharyngeal(NP) swabs in vial transport medium  Result Value Ref Range Status   SARS Coronavirus 2 by RT PCR NEGATIVE NEGATIVE Final    Comment: (NOTE) SARS-CoV-2 target nucleic acids are NOT DETECTED.  The SARS-CoV-2 RNA is generally detectable in upper respiratory specimens during the acute phase of infection. The lowest concentration of SARS-CoV-2 viral copies this assay can detect is 138 copies/mL. A negative result does not preclude SARS-Cov-2 infection and should not be used as the sole basis for treatment or other patient management decisions. A negative result may occur with  improper specimen collection/handling, submission of specimen other than nasopharyngeal swab, presence of viral mutation(s) within the areas targeted by this assay, and inadequate number of viral copies(<138 copies/mL). A negative result must be combined with clinical observations, patient history, and epidemiological information. The expected result is Negative.  Fact Sheet for Patients:  EntrepreneurPulse.com.au  Fact Sheet for Healthcare Providers:  IncredibleEmployment.be  This test is no t yet approved or cleared by the [place] FDA and  has been authorized for detection and/or diagnosis of SARS-CoV-2 by FDA under an Emergency Use Authorization (EUA). This EUA will remain  in effect (meaning this test can be used) for the duration of the COVID-19 declaration under Section 564(b)(1) of the Act, 21 U.S.C.section 360bbb-3(b)(1), unless the authorization is terminated  or revoked sooner.       Influenza A by PCR NEGATIVE NEGATIVE Final   Influenza B by PCR NEGATIVE NEGATIVE Final    Comment: (NOTE) The Xpert Xpress  SARS-CoV-2/FLU/RSV plus assay is intended as an aid in the diagnosis of influenza from Nasopharyngeal swab specimens and should not be used as a sole basis for treatment. Nasal washings and aspirates are unacceptable for Xpert Xpress SARS-CoV-2/FLU/RSV testing.  Fact Sheet for Patients: EntrepreneurPulse.com.au  Fact Sheet for Healthcare Providers: IncredibleEmployment.be  This test is not yet approved or cleared by the [place] FDA and has been authorized for detection and/or diagnosis of SARS-CoV-2 by FDA under an Emergency Use Authorization (EUA). This EUA will remain in effect (meaning this test can be used) for the duration of the COVID-19 declaration under Section 564(b)(1) of the Act, 21 U.S.C. section 360bbb-3(b)(1), unless the authorization is terminated or revoked.  Performed at [facility], [address] [place], [place] [address]      Labs: BNP (last 3 results) Recent Labs    12/31/20 1853  BNP Q000111Q*   Basic Metabolic Panel: Recent Labs  Lab 12/31/20 1853 01/01/21 0257  NA 135 133*  K 3.5 4.0  CL 102 100  CO2 23 21*  GLUCOSE 251* 298*  BUN 15 17  CREATININE 0.74 0.77  CALCIUM 8.8* 9.0  MG  --  1.9  PHOS  --  3.3   Liver Function Tests: Recent Labs  Lab 01/01/21 0257  AST 51*  ALT 48*  ALKPHOS 80  BILITOT 0.7  PROT 7.8  ALBUMIN 3.9   No results for input(s): LIPASE, AMYLASE in the last 168 hours. No results for input(s): AMMONIA in the last 168 hours. CBC: Recent Labs  Lab 12/31/20 1853 01/01/21 0257  WBC 10.7* 13.6*  HGB 14.5 15.2*  HCT 42.9 47.0*  MCV 85.5 86.9  PLT 217 255   Cardiac Enzymes: No results for input(s): CKTOTAL, CKMB, CKMBINDEX, TROPONINI in the last 168 hours. BNP: Invalid input(s): POCBNP CBG: No results for input(s): GLUCAP in the last 168 hours. D-Dimer No results for input(s): DDIMER in the last 72 hours. Hgb A1c Recent Labs    01/01/21 0257  HGBA1C 7.4*    Lipid Profile No results for input(s): CHOL, HDL, LDLCALC, TRIG, CHOLHDL, LDLDIRECT in the last 72 hours. Thyroid function studies Recent Labs    01/01/21 0431  TSH 10.791*   Anemia work up No results for input(s): VITAMINB12, FOLATE, FERRITIN, TIBC, IRON, RETICCTPCT in the last 72 hours. Urinalysis    Component Value Date/Time   COLORURINE YELLOW 09/11/2013 0932   APPEARANCEUR CLEAR 09/11/2013 0932   LABSPEC 1.008 09/11/2013 0932   PHURINE 7.5 09/11/2013 0932   GLUCOSEU NEGATIVE 09/11/2013 0932   HGBUR NEGATIVE 09/11/2013 0932   BILIRUBINUR NEGATIVE 09/11/2013 0932   KETONESUR NEGATIVE 09/11/2013 0932   PROTEINUR NEGATIVE 09/11/2013 0932   UROBILINOGEN 1.0 09/11/2013 0932   NITRITE NEGATIVE 09/11/2013 0932   LEUKOCYTESUR NEGATIVE 09/11/2013 0932   Sepsis Labs Invalid input(s): PROCALCITONIN,  WBC,  LACTICIDVEN Microbiology Recent Results (from the past 240 hour(s))  Resp Panel by RT-PCR (Flu A&B, Covid) Nasopharyngeal Swab     Status: None   Collection Time: 12/31/20  8:19 PM   Specimen: Nasopharyngeal Swab; Nasopharyngeal(NP) swabs in vial transport medium  Result Value Ref Range Status   SARS Coronavirus 2 by RT PCR NEGATIVE NEGATIVE Final    Comment: (NOTE) SARS-CoV-2 target nucleic acids are NOT DETECTED.  The SARS-CoV-2 RNA is generally detectable in upper respiratory specimens during the acute phase of infection. The lowest concentration of SARS-CoV-2 viral copies this assay can detect is 138 copies/mL. A negative result does not preclude SARS-Cov-2 infection and should not be used as the sole basis for treatment or other patient management decisions. A negative result may occur with  improper specimen collection/handling, submission of specimen other than nasopharyngeal swab, presence of viral mutation(s) within the areas targeted by this assay, and inadequate number of viral copies(<138 copies/mL). A negative result must be combined with clinical  observations, patient history, and epidemiological information. The expected result is Negative.  Fact Sheet for Patients:  EntrepreneurPulse.com.au  Fact Sheet for Healthcare Providers:  IncredibleEmployment.be  This test is no t yet approved or cleared by the [place] FDA and  has been authorized for detection and/or diagnosis of SARS-CoV-2 by FDA under an Emergency Use Authorization (EUA). This EUA will remain  in effect (meaning this test can be used) for the duration of the COVID-19 declaration under Section 564(b)(1) of the Act, 21 U.S.C.section 360bbb-3(b)(1), unless the authorization is terminated  or revoked sooner.       Influenza A by PCR NEGATIVE NEGATIVE Final   Influenza B by PCR NEGATIVE NEGATIVE Final    Comment: (NOTE) The Xpert Xpress SARS-CoV-2/FLU/RSV plus assay is intended as an aid in the diagnosis of influenza from Nasopharyngeal swab specimens and should not be used as a sole basis for treatment. Nasal washings and aspirates are unacceptable for Xpert Xpress SARS-CoV-2/FLU/RSV testing.  Fact Sheet for Patients: EntrepreneurPulse.com.au  Fact Sheet for Healthcare Providers: IncredibleEmployment.be  This test is not yet approved or cleared by the [place] FDA and has been authorized for detection and/or diagnosis of SARS-CoV-2 by FDA under an Emergency Use Authorization (EUA). This EUA will remain in effect (meaning this test can be used)  for the duration of the COVID-19 declaration under Section 564(b)(1) of the Act, 21 U.S.C. section 360bbb-3(b)(1), unless the authorization is terminated or revoked.  Performed at [facility], [address], [place], [place] [address]      Time coordinating discharge: 35 minutes  SIGNED:   Erick Blinks, DO [facility] 01/01/2021, 3:00 PM  If 7PM-7AM, please contact night-coverage [email or web address]

## 2021-01-01 NOTE — TOC Transition Note (Signed)
Transition of Care [facility]) - CM/SW Discharge Note   Patient Details  Name: [name] MRN: [id number] Date of Birth: 1950-10-21  Transition of Care [facility]) CM/SW Contact:  Shade Flood, LCSW Phone Number: 01/01/2021, 1:38 PM   Clinical Narrative:     Pt admitted from home. Received TOC consult for HF screen. Per MD, pt will likely dc home today. Met with pt and her husband today to assess and review dc planning. Pt does not currently follow a cardiac diet or weigh every day. RN will be providing CHF Living Better booklet for pt's reference. Offered HHRN though pt declines.   Reviewed co-pay cost of Eliquis with pt and her husband and they stated that they can afford this.  There are no TOC needs identified for dc.  Expected Discharge Plan: Home/Self Care Barriers to Discharge: Continued Medical Work up   Patient Goals and CMS Choice Patient states their goals for this hospitalization and ongoing recovery are:: go home      Expected Discharge Plan and Services Expected Discharge Plan: Home/Self Care In-house Referral: Clinical Social Work     Living arrangements for the past 2 months: Single Family Home                                      Prior Living Arrangements/Services Living arrangements for the past 2 months: Single Family Home Lives with:: Spouse Patient language and need for interpreter reviewed:: Yes Do you feel safe going back to the place where you live?: Yes      Need for Family Participation in Patient Care: Yes (Comment) Care giver support system in place?: Yes (comment)   Criminal Activity/Legal Involvement Pertinent to Current Situation/Hospitalization: No - Comment as needed  Activities of Daily Living      Permission Sought/Granted                  Emotional Assessment Appearance:: Appears stated age Attitude/Demeanor/Rapport: Engaged Affect (typically observed): Pleasant Orientation: : Oriented to Self, Oriented to Place,  Oriented to  Time, Oriented to Situation Alcohol / Substance Use: Not Applicable Psych Involvement: No (comment)  Admission diagnosis:  CHF (congestive heart failure) ([place]) [I50.9] Atrial fibrillation with RVR ([place]) [I48.91] Acute congestive heart failure, unspecified heart failure type ([place]) [I50.9] Patient Active Problem List   Diagnosis Date Noted   Paroxysmal atrial fibrillation with RVR ([place]) 01/01/2021   Elevated troponin 01/01/2021   Hyperglycemia 01/01/2021   Obesity (BMI 30-39.9) 01/01/2021   Elevated brain natriuretic peptide (BNP) level 01/01/2021   CHF (congestive heart failure) ([place]) 12/31/2020   Postoperative anemia due to acute blood loss 09/25/2013   S/P left THA, AA 09/24/2013   Spinal stenosis, lumbar region, with neurogenic claudication 04/04/2013   Morbid obesity ([place]) 04/04/2013   PCP:  [place], [place]:   [place], [place] - [place] [address] [place] [address] Phone: [phone] Fax: [phone]     Social Determinants of Health (SDOH) Interventions    Readmission Risk Interventions Readmission Risk Prevention Plan 01/01/2021  Medication Screening Complete  Transportation Screening Complete  Some recent data might be hidden        Barriers to Discharge: Continued Medical Work up   Patient Goals and CMS Choice Patient states their goals for this hospitalization and ongoing recovery are:: go home      Discharge Placement  Discharge Plan and Services In-house Referral: Clinical Social Work                                   Social Determinants of Health (SDOH) Interventions     Readmission Risk Interventions Readmission Risk Prevention Plan 01/01/2021  Medication Screening Complete  Transportation Screening Complete  Some recent data might be hidden

## 2021-01-01 NOTE — TOC Benefit Eligibility Note (Signed)
Patient Product/process development scientist completed.    The patient is currently admitted and upon discharge could be taking Eliquis 5 mg.  The current 30 day co-pay is, $527.00 due to a $480.00 deductible remaining.  .   The patient is insured through [employer] Medicare Part D     Roland Earl, CPhT Pharmacy Patient Advocate Specialist [facility] Patient Advocate Team Direct Number: [phone]  Fax: [phone]

## 2021-02-03 NOTE — Progress Notes (Signed)
Cardiology Office Note    Date:  02/04/2021   ID:  [name], DOB Sep 28, 1950, MRN [id number]  PCP:  [facility]  Cardiologist: Rozann Lesches, MD    Chief Complaint  Patient presents with   Hospitalization Follow-up    History of Present Illness:    [name] is a 70 y.o. female with past medical history of HTN, OA and newly diagnosed atrial fibrillation (diagnosed during admission in 12/2020 and spontaneously converted back to NSR) who presents to the office today for hospital follow-up.  She was admitted to [facility] on 12/2020 for evaluation of chest pain and tachycardia and reported having intermittent palpitations for the past year but her symptoms the day of admission had persisted and not resolved on their own. She was found to be in atrial fibrillation with RVR while in the ED and was started on IV Cardizem and converted back to normal sinus rhythm. Electrolytes were stable but TSH was elevated at 10.791 during admission. Echocardiogram showed a preserved EF of 60 to 65% with no regional wall motion abnormalities. She did have mild LVH and trivial MR. She was started on Lopressor 25 mg twice daily along with Eliquis 5 mg twice daily for anticoagulation. Given her daytime somnolence and reports of snoring, an outpatient sleep study was also recommended.  In talking with the patient and her husband today, she reports overall feeling well since her recent hospitalization. She does experience occasional brief palpitations but symptoms only last for a few seconds and spontaneously resolve. No recent chest pain, dyspnea on exertion, orthopnea or PND. She has experienced improvement in her lower extremity edema since switching from HCTZ to Lasix. Reports good compliance with Eliquis and no evidence of active bleeding.  Past Medical History:  Diagnosis Date   Arthritis    Hypertension    Lumbar disc herniation    hx. of. surgery 2'15   PAF (paroxysmal  atrial fibrillation) ([place])    a. diagnosed during admission in 12/2020 and spontaneously converted back to NSR    Past Surgical History:  Procedure Laterality Date   ABDOMINAL HYSTERECTOMY     HEMI-MICRODISCECTOMY LUMBAR LAMINECTOMY LEVEL 1 Left 04/04/2013   Procedure: HEMI-  LAMINECTOMY MICRODISCECTOMY OF L5 -S1 ON LEFT  ;  Surgeon: Tobi Bastos, MD;  Location: [facility];  Service: Orthopedics;  Laterality: Left;   JOINT REPLACEMENT Right 2012   knee   JOINT REPLACEMENT Right 2010   hip   TOTAL HIP ARTHROPLASTY Left 09/24/2013   Procedure: LEFT TOTAL HIP ARTHROPLASTY ANTERIOR APPROACH;  Surgeon: Mauri Pole, MD;  Location: [facility];  Service: Orthopedics;  Laterality: Left;    Current Medications: Outpatient Medications Prior to Visit  Medication Sig Dispense Refill   apixaban (ELIQUIS) 5 MG TABS tablet Take 1 tablet (5 mg total) by mouth 2 (two) times daily. 60 tablet 3   furosemide (LASIX) 20 MG tablet Take 1 tablet (20 mg total) by mouth daily. 30 tablet 11   metoprolol tartrate (LOPRESSOR) 25 MG tablet Take 1 tablet (25 mg total) by mouth 2 (two) times daily. 60 tablet 2   docusate sodium 100 MG CAPS Take 100 mg by mouth 2 (two) times daily. (Patient not taking: Reported on 12/31/2020) 10 capsule 0   ferrous sulfate 325 (65 FE) MG tablet Take 1 tablet (325 mg total) by mouth 3 (three) times daily after meals. (Patient not taking: Reported on 12/31/2020)  3   ibuprofen (ADVIL) 200 MG tablet  Take 400 mg by mouth every 6 (six) hours as needed. (Patient not taking: Reported on 02/04/2021)     methocarbamol (ROBAXIN) 500 MG tablet Take 1 tablet (500 mg total) by mouth every 6 (six) hours as needed for muscle spasms. (Patient not taking: Reported on 12/31/2020) 50 tablet 0   polyethylene glycol (MIRALAX / GLYCOLAX) packet Take 17 g by mouth 2 (two) times daily. (Patient not taking: Reported on 12/31/2020) 14 each 0   No facility-administered medications prior to visit.     Allergies:    Patient has no known allergies.   Social History   Socioeconomic History   Marital status: Married    Spouse name: Not on file   Number of children: Not on file   Years of education: Not on file   Highest education level: Not on file  Occupational History   Not on file  Tobacco Use   Smoking status: Never   Smokeless tobacco: Never  Vaping Use   Vaping Use: Never used  Substance and Sexual Activity   Alcohol use: No   Drug use: No   Sexual activity: Yes  Other Topics Concern   Not on file  Social History Narrative   Not on file   Social Determinants of Health   Financial Resource Strain: Not on file  Food Insecurity: Not on file  Transportation Needs: Not on file  Physical Activity: Not on file  Stress: Not on file  Social Connections: Not on file     Family History:  The patient's family history is not on file. Patient unaware of her family history.   Review of Systems:    Please see the history of present illness.     All other systems reviewed and are otherwise negative except as noted above.   Physical Exam:    VS:  BP 138/82    Pulse 84    Ht $R'5\' 5"'hC$  (1.651 m)    Wt 300 lb (136.1 kg)    SpO2 98%    BMI 49.92 kg/m    General: Well developed, well nourished,female appearing in no acute distress. Head: Normocephalic, atraumatic. Neck: No carotid bruits. JVD not elevated.  Lungs: Respirations regular and unlabored, without wheezes or rales.  Heart: Regular rate and rhythm. No S3 or S4.  No murmur, no rubs, or gallops appreciated. Abdomen: Appears non-distended. No obvious abdominal masses. Msk:  Strength and tone appear normal for age. No obvious joint deformities or effusions. Extremities: No clubbing or cyanosis. Trace ankle edema bilaterally.  Distal pedal pulses are 2+ bilaterally. Neuro: Alert and oriented X 3. Moves all extremities spontaneously. No focal deficits noted. Psych:  Responds to questions appropriately with a normal affect. Skin: No rashes  or lesions noted  Wt Readings from Last 3 Encounters:  02/04/21 300 lb (136.1 kg)  12/31/20 240 lb 1.3 oz (108.9 kg)  09/24/13 240 lb (108.9 kg)     Studies/Labs Reviewed:   EKG:  EKG is not ordered today.   Recent Labs: 12/31/2020: B Natriuretic Peptide 351.0 01/01/2021: ALT 48; BUN 17; Creatinine, Ser 0.77; Hemoglobin 15.2; Magnesium 1.9; Platelets 255; Potassium 4.0; Sodium 133; TSH 10.791   Lipid Panel No results found for: CHOL, TRIG, HDL, CHOLHDL, VLDL, LDLCALC, LDLDIRECT  Additional studies/ records that were reviewed today include:   Echocardiogram: 01/01/2021 IMPRESSIONS     1. Images are limited.   2. Left ventricular ejection fraction, by estimation, is 60 to 65%. The  left ventricle has normal function. The left  ventricle has no regional  wall motion abnormalities. There is mild left ventricular hypertrophy.  Left ventricular diastolic parameters  are indeterminate.   3. Right ventricular systolic function is normal. The right ventricular  size is normal. Tricuspid regurgitation signal is inadequate for assessing  PA pressure.   4. Left atrial size was mildly dilated.   5. The mitral valve was not well visualized. Trivial mitral valve  regurgitation. No evidence of mitral stenosis. The mean mitral valve  gradient is 2.0 mmHg.   6. The aortic valve was not well visualized. Aortic valve regurgitation  is not visualized. Aortic valve mean gradient measures 9.0 mmHg.   Assessment:    1. PAF (paroxysmal atrial fibrillation) ([place])   2. Bilateral lower extremity edema   3. Sleep-disordered breathing   4. Elevated TSH   5. Medication management   6. Need for immunization against influenza      Plan:   In order of problems listed above:  1. Paroxysmal Atrial Fibrillation - She reports only brief palpitations which last for a few seconds and spontaneously resolve. No symptoms resembling her episodes at the time of admission. Will continue Lopressor 25 mg  twice daily. I encouraged her to make [place] aware if she experiences worsening symptoms as this could be titrated. I did not further titrate today as she reports her SBP has been in the 110's when checked at home intermittently. - No reports of active bleeding. She remains on Eliquis 5 mg twice daily for anticoagulation. She had not met her deductible for this year and they had to pay over $500 for most recent prescription. Samples were provided today along with paperwork for patient assistance.  2. Lower Extremity Edema - She reports her symptoms have significantly improved since her hospitalization. She remains on Lasix 20 mg daily. Will recheck BMET today for reassessment of renal function and electrolytes.  3. Recommendation for Sleep Study - She denies any recent snoring and her husband who is with her today confirms that she does not snore. Given improvement in her symptoms, she wishes to hold off on a sleep study for now. Would address again at follow-up.  4. Elevated TSH - Her TSH was elevated to 10.791 during her recent admission and she reports being told this was elevated in the past but was not started on thyroid replacement by her PCP. Will recheck Free T3 and T4.   5. Need for Influenza Vaccination - Flu shot was administered by nursing staff during today's visit.    Medication Adjustments/Labs and Tests Ordered: Current medicines are reviewed at length with the patient today.  Concerns regarding medicines are outlined above.  Medication changes, Labs and Tests ordered today are listed in the Patient Instructions below. Patient Instructions  Medication Instructions:   Your physician recommends that you continue on your current medications as directed. Please refer to the Current Medication list given to you today.   Labwork:  BMET, Free T3,Free T4  Testing/Procedures: None   Follow-Up: 3-4 months  Any Other Special Instructions Will Be Listed Below (If Applicable).  If  you need a refill on your cardiac medications before your next appointment, please call your pharmacy.    Signed, Erma Heritage, PA-C  02/04/2021 5:01 PM    [facility] [address] [place], [place] [address] Phone: [phone] Fax: [phone]

## 2021-02-04 ENCOUNTER — Ambulatory Visit (INDEPENDENT_AMBULATORY_CARE_PROVIDER_SITE_OTHER): Payer: Medicare Other | Admitting: Student

## 2021-02-04 ENCOUNTER — Encounter: Payer: Self-pay | Admitting: Student

## 2021-02-04 ENCOUNTER — Other Ambulatory Visit: Payer: Self-pay

## 2021-02-04 VITALS — BP 138/82 | HR 84 | Ht 65.0 in | Wt 300.0 lb

## 2021-02-04 DIAGNOSIS — R600 Localized edema: Secondary | ICD-10-CM | POA: Diagnosis not present

## 2021-02-04 DIAGNOSIS — Z79899 Other long term (current) drug therapy: Secondary | ICD-10-CM | POA: Diagnosis not present

## 2021-02-04 DIAGNOSIS — R7989 Other specified abnormal findings of blood chemistry: Secondary | ICD-10-CM | POA: Diagnosis not present

## 2021-02-04 DIAGNOSIS — I480 Paroxysmal atrial fibrillation: Secondary | ICD-10-CM | POA: Diagnosis not present

## 2021-02-04 DIAGNOSIS — G4730 Sleep apnea, unspecified: Secondary | ICD-10-CM

## 2021-02-04 DIAGNOSIS — Z23 Encounter for immunization: Secondary | ICD-10-CM | POA: Diagnosis not present

## 2021-02-04 NOTE — Patient Instructions (Signed)
Medication Instructions:   Your physician recommends that you continue on your current medications as directed. Please refer to the Current Medication list given to you today.   Labwork:  BMET, Free T3,Free T4  Testing/Procedures: None   Follow-Up: 3-4 months  Any Other Special Instructions Will Be Listed Below (If Applicable).  If you need a refill on your cardiac medications before your next appointment, please call your pharmacy.

## 2021-03-04 DIAGNOSIS — I4891 Unspecified atrial fibrillation: Secondary | ICD-10-CM | POA: Diagnosis not present

## 2021-03-04 DIAGNOSIS — Z91199 Patient's noncompliance with other medical treatment and regimen due to unspecified reason: Secondary | ICD-10-CM | POA: Diagnosis not present

## 2021-03-04 DIAGNOSIS — E039 Hypothyroidism, unspecified: Secondary | ICD-10-CM | POA: Diagnosis not present

## 2021-03-04 DIAGNOSIS — E119 Type 2 diabetes mellitus without complications: Secondary | ICD-10-CM | POA: Diagnosis not present

## 2021-03-04 DIAGNOSIS — I10 Essential (primary) hypertension: Secondary | ICD-10-CM | POA: Diagnosis not present

## 2021-03-04 DIAGNOSIS — R945 Abnormal results of liver function studies: Secondary | ICD-10-CM | POA: Diagnosis not present

## 2021-03-04 DIAGNOSIS — Z0000 Encounter for general adult medical examination without abnormal findings: Secondary | ICD-10-CM | POA: Diagnosis not present

## 2021-03-04 DIAGNOSIS — Z6841 Body Mass Index (BMI) 40.0 and over, adult: Secondary | ICD-10-CM | POA: Diagnosis not present

## 2021-03-04 DIAGNOSIS — Z1331 Encounter for screening for depression: Secondary | ICD-10-CM | POA: Diagnosis not present

## 2021-03-04 DIAGNOSIS — G4730 Sleep apnea, unspecified: Secondary | ICD-10-CM | POA: Diagnosis not present

## 2021-03-09 DIAGNOSIS — H35372 Puckering of macula, left eye: Secondary | ICD-10-CM | POA: Diagnosis not present

## 2021-04-13 DIAGNOSIS — H35372 Puckering of macula, left eye: Secondary | ICD-10-CM | POA: Diagnosis not present

## 2021-05-03 DIAGNOSIS — Z6841 Body Mass Index (BMI) 40.0 and over, adult: Secondary | ICD-10-CM | POA: Diagnosis not present

## 2021-05-03 DIAGNOSIS — E039 Hypothyroidism, unspecified: Secondary | ICD-10-CM | POA: Diagnosis not present

## 2021-05-03 DIAGNOSIS — I4891 Unspecified atrial fibrillation: Secondary | ICD-10-CM | POA: Diagnosis not present

## 2021-05-03 DIAGNOSIS — E119 Type 2 diabetes mellitus without complications: Secondary | ICD-10-CM | POA: Diagnosis not present

## 2021-05-03 DIAGNOSIS — Z20822 Contact with and (suspected) exposure to covid-19: Secondary | ICD-10-CM | POA: Diagnosis not present

## 2021-05-10 DIAGNOSIS — Z20822 Contact with and (suspected) exposure to covid-19: Secondary | ICD-10-CM | POA: Diagnosis not present

## 2021-05-20 DIAGNOSIS — Z20828 Contact with and (suspected) exposure to other viral communicable diseases: Secondary | ICD-10-CM | POA: Diagnosis not present

## 2021-05-25 DIAGNOSIS — Z20828 Contact with and (suspected) exposure to other viral communicable diseases: Secondary | ICD-10-CM | POA: Diagnosis not present

## 2021-05-26 DIAGNOSIS — Z20822 Contact with and (suspected) exposure to covid-19: Secondary | ICD-10-CM | POA: Diagnosis not present

## 2021-06-02 DIAGNOSIS — Z20822 Contact with and (suspected) exposure to covid-19: Secondary | ICD-10-CM | POA: Diagnosis not present

## 2021-06-04 DIAGNOSIS — Z20822 Contact with and (suspected) exposure to covid-19: Secondary | ICD-10-CM | POA: Diagnosis not present

## 2021-06-11 DIAGNOSIS — Z20822 Contact with and (suspected) exposure to covid-19: Secondary | ICD-10-CM | POA: Diagnosis not present

## 2021-06-18 DIAGNOSIS — Z20822 Contact with and (suspected) exposure to covid-19: Secondary | ICD-10-CM | POA: Diagnosis not present

## 2021-06-21 ENCOUNTER — Ambulatory Visit: Payer: Medicare Other | Admitting: Cardiology

## 2021-06-22 DIAGNOSIS — Z6841 Body Mass Index (BMI) 40.0 and over, adult: Secondary | ICD-10-CM | POA: Diagnosis not present

## 2021-06-22 DIAGNOSIS — E039 Hypothyroidism, unspecified: Secondary | ICD-10-CM | POA: Diagnosis not present

## 2021-07-12 DIAGNOSIS — R079 Chest pain, unspecified: Secondary | ICD-10-CM | POA: Diagnosis not present

## 2021-08-03 DIAGNOSIS — Z6841 Body Mass Index (BMI) 40.0 and over, adult: Secondary | ICD-10-CM | POA: Diagnosis not present

## 2021-08-03 DIAGNOSIS — M5431 Sciatica, right side: Secondary | ICD-10-CM | POA: Diagnosis not present

## 2021-11-15 ENCOUNTER — Ambulatory Visit
Admission: EM | Admit: 2021-11-15 | Discharge: 2021-11-15 | Disposition: A | Discharge status: Home / Self Care | Payer: Medicare Other | Attending: Family Medicine | Admitting: Family Medicine

## 2021-11-15 DIAGNOSIS — R112 Nausea with vomiting, unspecified: Secondary | ICD-10-CM

## 2021-11-15 MED ORDER — ONDANSETRON 4 MG PO TBDP
4.0000 mg | ORAL_TABLET | Freq: Once | ORAL | Status: AC
  Administered 2021-11-15: 4 mg via ORAL

## 2021-11-15 NOTE — ED Provider Notes (Signed)
[facility]    CSN: [id number] Arrival date & time: 11/15/21  1417      History   Chief Complaint Chief Complaint  Patient presents with   Emesis    HPI [name] is a 71 y.o. female.   Patient presenting today with 2-day history of nausea, vomiting, body aches, chills.  Denies diarrhea, constipation, abdominal pain, cough, congestion, sore throat, chest pain, shortness of breath.  So far not trying anything over-the-counter for symptoms.  Is able to tolerate small sips of water.  States she thinks it may have been some lunch meat that she ate 2 days ago, however it was in date and she just open the package for the first time.  No new sick contacts recently.    Past Medical History:  Diagnosis Date   Arthritis    Hypertension    Lumbar disc herniation    hx. of. surgery 2'15   PAF (paroxysmal atrial fibrillation) ([facility])    a. diagnosed during admission in 12/2020 and spontaneously converted back to NSR   Patient Active Problem List   Diagnosis Date Noted   Paroxysmal atrial fibrillation with RVR ([facility]) 01/01/2021   Elevated troponin 01/01/2021   Hyperglycemia 01/01/2021   Obesity (BMI 30-39.9) 01/01/2021   Elevated brain natriuretic peptide (BNP) level 01/01/2021   New onset a-fib ([facility]) 01/01/2021   CHF (congestive heart failure) ([facility]) 12/31/2020   Postoperative anemia due to acute blood loss 09/25/2013   S/P left THA, AA 09/24/2013   Spinal stenosis, lumbar region, with neurogenic claudication 04/04/2013   Morbid obesity ([facility]) 04/04/2013   Past Surgical History:  Procedure Laterality Date   ABDOMINAL HYSTERECTOMY     HEMI-MICRODISCECTOMY LUMBAR LAMINECTOMY LEVEL 1 Left 04/04/2013   Procedure: HEMI-  LAMINECTOMY MICRODISCECTOMY OF L5 -S1 ON LEFT  ;  Surgeon: Jacki Cones, MD;  Location: [facility];  Service: Orthopedics;  Laterality: Left;   JOINT REPLACEMENT Right 2012   knee   JOINT REPLACEMENT Right 2010   hip   TOTAL HIP ARTHROPLASTY Left  09/24/2013   Procedure: LEFT TOTAL HIP ARTHROPLASTY ANTERIOR APPROACH;  Surgeon: Shelda Pal, MD;  Location: [facility];  Service: Orthopedics;  Laterality: Left;   OB History   No obstetric history on file.      Home Medications    Prior to Admission medications   Medication Sig Start Date End Date Taking? Authorizing Provider  apixaban (ELIQUIS) 5 MG TABS tablet Take 1 tablet (5 mg total) by mouth 2 (two) times daily. 01/01/21   Sherryll Burger, Pratik D, DO  docusate sodium 100 MG CAPS Take 100 mg by mouth 2 (two) times daily. Patient not taking: Reported on 12/31/2020 09/25/13   Lanney Gins, PA-C  ferrous sulfate 325 (65 FE) MG tablet Take 1 tablet (325 mg total) by mouth 3 (three) times daily after meals. Patient not taking: Reported on 12/31/2020 09/25/13   Lanney Gins, PA-C  furosemide (LASIX) 20 MG tablet Take 1 tablet (20 mg total) by mouth daily. 01/01/21 01/01/22  Sherryll Burger, Pratik D, DO  ibuprofen (ADVIL) 200 MG tablet Take 400 mg by mouth every 6 (six) hours as needed. Patient not taking: Reported on 02/04/2021    [provider]  methocarbamol (ROBAXIN) 500 MG tablet Take 1 tablet (500 mg total) by mouth every 6 (six) hours as needed for muscle spasms. Patient not taking: Reported on 12/31/2020 09/25/13   Lanney Gins, PA-C  metoprolol tartrate (LOPRESSOR) 25 MG tablet Take 1 tablet (25 mg  total) by mouth 2 (two) times daily. 01/01/21 02/04/21  Manuella Ghazi, Pratik D, DO  polyethylene glycol (MIRALAX / GLYCOLAX) packet Take 17 g by mouth 2 (two) times daily. Patient not taking: Reported on 12/31/2020 09/25/13   Danae Orleans, PA-C    Family History History reviewed. No pertinent family history.  Social History Social History   Tobacco Use   Smoking status: Never   Smokeless tobacco: Never  Vaping Use   Vaping Use: Never used  Substance Use Topics   Alcohol use: No   Drug use: No     Allergies   Patient has no known allergies.   Review of Systems Review of  Systems Per HPI  Physical Exam Triage Vital Signs ED Triage Vitals  Enc Vitals Group     BP 11/15/21 1446 (!) 156/75     Pulse Rate 11/15/21 1446 80     Resp 11/15/21 1446 (!) 22     Temp 11/15/21 1446 98.8 F (37.1 C)     Temp src --      SpO2 11/15/21 1446 95 %     Weight --      Height --      Head Circumference --      Peak Flow --      Pain Score 11/15/21 1444 4     Pain Loc --      Pain Edu? --      Excl. in [place]? --    No data found.  Updated Vital Signs BP (!) 156/75   Pulse 80   Temp 98.8 F (37.1 C)   Resp (!) 22   SpO2 95%   Visual Acuity Right Eye Distance:   Left Eye Distance:   Bilateral Distance:    Right Eye Near:   Left Eye Near:    Bilateral Near:     Physical Exam Vitals and nursing note reviewed.  Constitutional:      Appearance: Normal appearance. She is not ill-appearing.  HENT:     Head: Atraumatic.     Mouth/Throat:     Mouth: Mucous membranes are moist.  Eyes:     Extraocular Movements: Extraocular movements intact.     Conjunctiva/sclera: Conjunctivae normal.  Cardiovascular:     Rate and Rhythm: Normal rate and regular rhythm.     Heart sounds: Normal heart sounds.  Pulmonary:     Effort: Pulmonary effort is normal.     Breath sounds: Normal breath sounds.  Abdominal:     General: Bowel sounds are normal. There is no distension.     Palpations: Abdomen is soft.     Tenderness: There is no abdominal tenderness. There is no right CVA tenderness, left CVA tenderness or guarding.  Musculoskeletal:        General: Normal range of motion.     Cervical back: Normal range of motion and neck supple.  Skin:    General: Skin is warm and dry.  Neurological:     Mental Status: She is alert and oriented to person, place, and time.  Psychiatric:        Mood and Affect: Mood normal.        Thought Content: Thought content normal.        Judgment: Judgment normal.      UC Treatments / Results  Labs (all labs ordered are listed,  but only abnormal results are displayed) Labs Reviewed - No data to display  EKG   Radiology No results found.  Procedures Procedures (including  critical care time)  Medications Ordered in UC Medications  ondansetron (ZOFRAN-ODT) disintegrating tablet 4 mg (4 mg Oral Given 11/15/21 1519)    Initial Impression / Assessment and Plan / UC Course  I have reviewed the triage vital signs and the nursing notes.  Pertinent labs & imaging results that were available during my care of the patient were reviewed by me and considered in my medical decision making (see chart for details).     Zofran given in clinic for active nausea, vitals and exam very reassuring with no red flag findings today.  Suspect viral GI illness to be causing symptoms.  Zofran, brat diet, fluids recommended.  Return for worsening symptoms.  Final Clinical Impressions(s) / UC Diagnoses   Final diagnoses:  Nausea and vomiting, unspecified vomiting type   Discharge Instructions   None    ED Prescriptions   None    PDMP not reviewed this encounter.   Particia Nearing, [place] 11/15/21 1626

## 2021-11-15 NOTE — ED Triage Notes (Signed)
Pt presents with c/o  nausea and vomiting for past 2 days

## 2021-12-24 DIAGNOSIS — Z1211 Encounter for screening for malignant neoplasm of colon: Secondary | ICD-10-CM | POA: Diagnosis not present

## 2021-12-30 ENCOUNTER — Ambulatory Visit
Admission: EM | Admit: 2021-12-30 | Discharge: 2021-12-30 | Disposition: A | Discharge status: Home / Self Care | Payer: Medicare Other | Attending: Nurse Practitioner | Admitting: Nurse Practitioner

## 2021-12-30 DIAGNOSIS — R112 Nausea with vomiting, unspecified: Secondary | ICD-10-CM | POA: Insufficient documentation

## 2021-12-30 DIAGNOSIS — Z1152 Encounter for screening for COVID-19: Secondary | ICD-10-CM | POA: Insufficient documentation

## 2021-12-30 MED ORDER — ONDANSETRON 4 MG PO TBDP: 4.0000 mg | ORAL_TABLET | Freq: Three times a day (TID) | ORAL | 0 refills | Status: DC | PRN

## 2021-12-30 MED ORDER — ONDANSETRON 4 MG PO TBDP
4.0000 mg | ORAL_TABLET | Freq: Once | ORAL | Status: AC
  Administered 2021-12-30: 4 mg via ORAL

## 2021-12-30 NOTE — ED Triage Notes (Signed)
Pt reports last night she vomited mucus, pain in her lower back, can't keep food down. She is bloated and stomach cramping, very nauseas. Cramping comes and goes.  Took 2 gas x but no relief.          *Did not take her bp meds this morning*

## 2021-12-30 NOTE — ED Provider Notes (Signed)
[facility]    CSN: [id number] Arrival date & time: 12/30/21  0808      History   Chief Complaint No chief complaint on file.   HPI [name] is a 71 y.o. female.   The history is provided by the patient.   Patient presenting today with 1 day history of nausea, vomiting, gas, bloating, and low back pain. Denies diarrhea, constipation, abdominal pain, cough, congestion, sore throat, chest pain, shortness of breath.  Reports that she has been drinking sugar-free Gatorade for her symptoms, which she is able to keep down.  She also reports that she has not taken her blood pressure medication this morning because she has not been able to keep anything down.  She cannot recall what she may have eaten to cause her symptoms.  She does report that she had the same or similar symptoms early last month.  She states that the medicine that she took her was given here in the clinic helped her symptoms.  She states that she has also been having a brat diet. No new sick contacts recently.   Past Medical History:  Diagnosis Date   Arthritis    Hypertension    Lumbar disc herniation    hx. of. surgery 2'15   PAF (paroxysmal atrial fibrillation) (HCC)    a. diagnosed during admission in 12/2020 and spontaneously converted back to NSR    Patient Active Problem List   Diagnosis Date Noted   Paroxysmal atrial fibrillation with RVR (HCC) 01/01/2021   Elevated troponin 01/01/2021   Hyperglycemia 01/01/2021   Obesity (BMI 30-39.9) 01/01/2021   Elevated brain natriuretic peptide (BNP) level 01/01/2021   New onset a-fib (HCC) 01/01/2021   CHF (congestive heart failure) (HCC) 12/31/2020   Postoperative anemia due to acute blood loss 09/25/2013   S/P left THA, AA 09/24/2013   Spinal stenosis, lumbar region, with neurogenic claudication 04/04/2013   Morbid obesity (HCC) 04/04/2013    Past Surgical History:  Procedure Laterality Date   ABDOMINAL HYSTERECTOMY      HEMI-MICRODISCECTOMY LUMBAR LAMINECTOMY LEVEL 1 Left 04/04/2013   Procedure: HEMI-  LAMINECTOMY MICRODISCECTOMY OF L5 -S1 ON LEFT  ;  Surgeon: Jacki Cones, MD;  Location: [facility];  Service: Orthopedics;  Laterality: Left;   JOINT REPLACEMENT Right 2012   knee   JOINT REPLACEMENT Right 2010   hip   TOTAL HIP ARTHROPLASTY Left 09/24/2013   Procedure: LEFT TOTAL HIP ARTHROPLASTY ANTERIOR APPROACH;  Surgeon: Shelda Pal, MD;  Location: [facility];  Service: Orthopedics;  Laterality: Left;    OB History   No obstetric history on file.      Home Medications    Prior to Admission medications   Medication Sig Start Date End Date Taking? Authorizing Provider  ondansetron (ZOFRAN-ODT) 4 MG disintegrating tablet Take 1 tablet (4 mg total) by mouth every 8 (eight) hours as needed for nausea or vomiting. 12/30/21  Yes Mehran Guderian-Warren, Sadie Haber, NP  apixaban (ELIQUIS) 5 MG TABS tablet Take 1 tablet (5 mg total) by mouth 2 (two) times daily. 01/01/21   Sherryll Burger, Pratik D, DO  docusate sodium 100 MG CAPS Take 100 mg by mouth 2 (two) times daily. Patient not taking: Reported on 12/31/2020 09/25/13   Lanney Gins, PA-C  ferrous sulfate 325 (65 FE) MG tablet Take 1 tablet (325 mg total) by mouth 3 (three) times daily after meals. Patient not taking: Reported on 12/31/2020 09/25/13   Lanney Gins, PA-C  furosemide (LASIX) 20 MG  tablet Take 1 tablet (20 mg total) by mouth daily. 01/01/21 01/01/22  Sherryll Burger, Pratik D, DO  ibuprofen (ADVIL) 200 MG tablet Take 400 mg by mouth every 6 (six) hours as needed. Patient not taking: Reported on 02/04/2021    [provider]  methocarbamol (ROBAXIN) 500 MG tablet Take 1 tablet (500 mg total) by mouth every 6 (six) hours as needed for muscle spasms. Patient not taking: Reported on 12/31/2020 09/25/13   Lanney Gins, PA-C  metoprolol tartrate (LOPRESSOR) 25 MG tablet Take 1 tablet (25 mg total) by mouth 2 (two) times daily. 01/01/21 02/04/21  Sherryll Burger, Pratik D, DO   polyethylene glycol (MIRALAX / GLYCOLAX) packet Take 17 g by mouth 2 (two) times daily. Patient not taking: Reported on 12/31/2020 09/25/13   Lanney Gins, PA-C    Family History History reviewed. No pertinent family history.  Social History Social History   Tobacco Use   Smoking status: Never   Smokeless tobacco: Never  Vaping Use   Vaping Use: Never used  Substance Use Topics   Alcohol use: No   Drug use: No     Allergies   Patient has no known allergies.   Review of Systems Review of Systems Per HPI  Physical Exam Triage Vital Signs ED Triage Vitals  Enc Vitals Group     BP 12/30/21 0831 (!) 180/83     Pulse Rate 12/30/21 0831 95     Resp 12/30/21 0831 20     Temp 12/30/21 0831 98.6 F (37 C)     Temp Source 12/30/21 0831 Oral     SpO2 12/30/21 0831 95 %     Weight --      Height --      Head Circumference --      Peak Flow --      Pain Score 12/30/21 0836 8     Pain Loc --      Pain Edu? --      Excl. in GC? --    No data found.  Updated Vital Signs BP (!) 180/83 (BP Location: Right Arm)   Pulse 95   Temp 98.6 F (37 C) (Oral)   Resp 20   SpO2 95%   Visual Acuity Right Eye Distance:   Left Eye Distance:   Bilateral Distance:    Right Eye Near:   Left Eye Near:    Bilateral Near:     Physical Exam Vitals and nursing note reviewed.  Constitutional:      General: She is not in acute distress.    Appearance: Normal appearance.  HENT:     Head: Normocephalic.  Eyes:     Extraocular Movements: Extraocular movements intact.     Conjunctiva/sclera: Conjunctivae normal.     Pupils: Pupils are equal, round, and reactive to light.  Cardiovascular:     Rate and Rhythm: Normal rate and regular rhythm.     Pulses: Normal pulses.     Heart sounds: Normal heart sounds.  Pulmonary:     Effort: Pulmonary effort is normal. No respiratory distress.     Breath sounds: Normal breath sounds. No stridor. No wheezing, rhonchi or rales.  Abdominal:      General: Abdomen is protuberant. Bowel sounds are normal.     Palpations: Abdomen is soft.     Tenderness: There is no abdominal tenderness.  Musculoskeletal:     Cervical back: Normal range of motion.  Lymphadenopathy:     Cervical: No cervical adenopathy.  Skin:  General: Skin is warm and dry.  Neurological:     General: No focal deficit present.     Mental Status: She is alert and oriented to person, place, and time.  Psychiatric:        Mood and Affect: Mood normal.        Behavior: Behavior normal.      UC Treatments / Results  Labs (all labs ordered are listed, but only abnormal results are displayed) Labs Reviewed  SARS CORONAVIRUS 2 (TAT 6-24 HRS)    EKG   Radiology No results found.  Procedures Procedures (including critical care time)  Medications Ordered in UC Medications  ondansetron (ZOFRAN-ODT) disintegrating tablet 4 mg (4 mg Oral Given 12/30/21 0857)    Initial Impression / Assessment and Plan / UC Course  I have reviewed the triage vital signs and the nursing notes.  Pertinent labs & imaging results that were available during my care of the patient were reviewed by me and considered in my medical decision making (see chart for details).  Patient is well-appearing, she is in no acute distress.  She is hypertensive as she has not taken her blood pressure medication today.  Nausea and vomiting, unspecified vomiting type  COVID test is pending.  Patient urinated before her appointment, unable to provide a urine sample. Symptoms appear to be consistent with a viral illness. Zofran 4 mg was prescribed for nausea and vomiting. Recommend a brat diet. Recommend ice chips, water, Pedialyte, or sugar-free Gatorade to maintain hydration. Strict ER indications were provided to the patient. Discussed with patient that because symptoms occurred previously and are returning, would like for her to follow-up with her primary care physician if she develops  further occurrences of the same or similar symptoms. Follow-up as needed.  Patient verbalizes understanding.  All questions were answered.  Patient is stable for discharge.  Final Clinical Impressions(s) / UC Diagnoses   Final diagnoses:  Nausea and vomiting, unspecified vomiting type     Discharge Instructions      COVID test is pending.  If the results of your COVID test are positive, you will be contacted.  Recommend antiviral therapy with molnupiravir if the COVID test is positive. Increase fluids and allow for plenty of rest. Take medication as prescribed. Recommend a brat diet, this includes bananas, rice, applesauce, and toast while symptoms persist. If symptoms do not improve with the medication prescribed today or if you develop new symptoms such as fever, chills, worsening abdominal pain, and nausea and vomiting that will not stop, please go to the emergency department immediately. If the same or similar symptoms develop in the future, recommend that you follow-up with your primary care physician for further evaluation. Follow-up as needed.     ED Prescriptions     Medication Sig Dispense Auth. Provider   ondansetron (ZOFRAN-ODT) 4 MG disintegrating tablet Take 1 tablet (4 mg total) by mouth every 8 (eight) hours as needed for nausea or vomiting. 20 tablet Maddix Kliewer-Warren, Sadie Haber, NP      PDMP not reviewed this encounter.   Abran Cantor, NP 12/30/21 [phone]

## 2021-12-30 NOTE — Discharge Instructions (Addendum)
COVID test is pending.  If the results of your COVID test are positive, you will be contacted.  Recommend antiviral therapy with molnupiravir if the COVID test is positive. Increase fluids and allow for plenty of rest. Take medication as prescribed. Recommend a brat diet, this includes bananas, rice, applesauce, and toast while symptoms persist. If symptoms do not improve with the medication prescribed today or if you develop new symptoms such as fever, chills, worsening abdominal pain, and nausea and vomiting that will not stop, please go to the emergency department immediately. If the same or similar symptoms develop in the future, recommend that you follow-up with your primary care physician for further evaluation. Follow-up as needed.

## 2021-12-31 LAB — SARS CORONAVIRUS 2 (TAT 6-24 HRS): SARS Coronavirus 2: NEGATIVE

## 2022-01-18 DIAGNOSIS — E039 Hypothyroidism, unspecified: Secondary | ICD-10-CM | POA: Diagnosis not present

## 2022-01-19 LAB — TSH: TSH: 2.74 (ref 0.41–5.90)

## 2022-02-09 ENCOUNTER — Telehealth: Payer: Self-pay | Admitting: Physician Assistant

## 2022-02-09 NOTE — Telephone Encounter (Signed)
Covering [place]. Received inbox notification of expiring labs. At last OV 02/04/21 [name] had ordered BMET, free T3 and T4 but patient did not get these done. Please find out if she has since seen PCP for labs or if she'd like to proceed with getting them done. Given duration of time from last labs would also add TSH to panel if she decides to come in for them. Also overdue for f/u, was supposed to f/u 3-4 months from 01/2021. Thank you!

## 2022-02-09 NOTE — Telephone Encounter (Signed)
Patient stated she had labs completed with pcp office. Will request labs from them. Pt stated that she did not want to reschedule her appointment at this time but would call our office when she did.

## 2022-02-10 ENCOUNTER — Encounter: Payer: Self-pay | Admitting: Family Medicine

## 2022-02-11 ENCOUNTER — Encounter: Payer: Self-pay | Admitting: Family Medicine

## 2022-05-17 NOTE — Progress Notes (Unsigned)
    Cardiology Office Note  Date: 05/18/2022   ID: [name], DOB 10-18-1950, MRN [id number]  History of Present Illness: [name] is a 72 y.o. female last seen in December 2022 by Ms. Strader PA-C, I reviewed the note.  She is here today with her husband for a follow-up visit.  She reports only occasional brief palpitations, no prolonged episodes or chest discomfort.  I reviewed her medications.  She reports no spontaneous bleeding problems on Eliquis and otherwise remains on Cardizem CD and Lopressor.  I reviewed her ECG which shows sinus rhythm with LVH and repolarization changes.  She is due for follow-up with PCP and lab work.  Physical Exam: VS:  BP 102/60   Pulse 85   Ht 5\' 5"  (1.651 m)   Wt 271 lb (122.9 kg)   SpO2 95%   BMI 45.10 kg/m , BMI Body mass index is 45.1 kg/m.  Wt Readings from Last 3 Encounters:  05/18/22 271 lb (122.9 kg)  02/04/21 300 lb (136.1 kg)  12/31/20 240 lb 1.3 oz (108.9 kg)    General: Patient appears comfortable at rest. HEENT: Conjunctiva and lids normal. Neck: Supple, no elevated JVP or carotid bruits. Lungs: Clear to auscultation, nonlabored breathing at rest. Cardiac: Regular rate and rhythm, no S3, 1/6 systolic murmur. Extremities: No pitting edema.  ECG:  An ECG dated 12/31/2020 was personally reviewed today and demonstrated:  Sinus rhythm with LVH and short PR interval.  Labwork:  January 2023: Hemoglobin 14.2, platelets 287, BUN 10, creatinine 0.7, potassium 4.3, AST 48, ALT 38, cholesterol 148, triglycerides 116, HDL 40, LDL 87 December 2023: TSH 2.74  Other Studies Reviewed Today:  No interval cardiac testing for review today.  Assessment and Plan:  1.  Paroxysmal atrial fibrillation diagnosed in 2022.  CHA2DS2-VASc score is 3.  She is on Eliquis for stroke prophylaxis.  She does not report any progressive or prolonged palpitations and is in sinus rhythm by ECG today.  No spontaneous bleeding problems.  Continue  Cardizem CD and Lopressor.  Follow-up with PCP for lab work as scheduled.  2.  Essential hypertension.  Blood pressure low normal today.  No changes made to current regimen.  Disposition:  Follow up  6 months.  Signed, Satira Sark, M.D., F.A.C.C. [place] [facility]

## 2022-05-18 ENCOUNTER — Encounter: Payer: Self-pay | Admitting: Cardiology

## 2022-05-18 ENCOUNTER — Ambulatory Visit: Payer: Medicare Other | Attending: Cardiology | Admitting: Cardiology

## 2022-05-18 VITALS — BP 102/60 | HR 85 | Ht 65.0 in | Wt 271.0 lb

## 2022-05-18 DIAGNOSIS — I480 Paroxysmal atrial fibrillation: Secondary | ICD-10-CM | POA: Insufficient documentation

## 2022-05-18 DIAGNOSIS — I10 Essential (primary) hypertension: Secondary | ICD-10-CM | POA: Insufficient documentation

## 2022-05-18 NOTE — Patient Instructions (Signed)
Medication Instructions:  Your physician recommends that you continue on your current medications as directed. Please refer to the Current Medication list given to you today.  *If you need a refill on your cardiac medications before your next appointment, please call your pharmacy*   Lab Work: None If you have labs (blood work) drawn today and your tests are completely normal, you will receive your results only by: [place] (if you have MyChart) OR A paper copy in the mail If you have any lab test that is abnormal or we need to change your treatment, we will call you to review the results.   Testing/Procedures: None   Follow-Up: At [facility], you and your health needs are our priority.  As part of our continuing mission to provide you with exceptional heart care, we have created designated Provider Care Teams.  These Care Teams include your primary Cardiologist (physician) and Advanced Practice Providers (APPs -  Physician Assistants and Nurse Practitioners) who all work together to provide you with the care you need, when you need it.  We recommend signing up for the patient portal called "MyChart".  Sign up information is provided on this After Visit Summary.  MyChart is used to connect with patients for Virtual Visits (Telemedicine).  Patients are able to view lab/test results, encounter notes, upcoming appointments, etc.  Non-urgent messages can be sent to your provider as well.   To learn more about what you can do with MyChart, go to [email or web address].    Your next appointment:   6 month(s)  Provider:   Rozann Lesches, MD    Other Instructions

## 2022-05-20 DIAGNOSIS — Z1331 Encounter for screening for depression: Secondary | ICD-10-CM | POA: Diagnosis not present

## 2022-05-20 DIAGNOSIS — E1159 Type 2 diabetes mellitus with other circulatory complications: Secondary | ICD-10-CM | POA: Diagnosis not present

## 2022-05-20 DIAGNOSIS — Z6841 Body Mass Index (BMI) 40.0 and over, adult: Secondary | ICD-10-CM | POA: Diagnosis not present

## 2022-05-20 DIAGNOSIS — I4891 Unspecified atrial fibrillation: Secondary | ICD-10-CM | POA: Diagnosis not present

## 2022-05-20 DIAGNOSIS — Z91199 Patient's noncompliance with other medical treatment and regimen due to unspecified reason: Secondary | ICD-10-CM | POA: Diagnosis not present

## 2022-05-20 DIAGNOSIS — R945 Abnormal results of liver function studies: Secondary | ICD-10-CM | POA: Diagnosis not present

## 2022-05-20 DIAGNOSIS — Z0000 Encounter for general adult medical examination without abnormal findings: Secondary | ICD-10-CM | POA: Diagnosis not present

## 2022-05-20 DIAGNOSIS — E039 Hypothyroidism, unspecified: Secondary | ICD-10-CM | POA: Diagnosis not present

## 2022-05-20 DIAGNOSIS — I10 Essential (primary) hypertension: Secondary | ICD-10-CM | POA: Diagnosis not present

## 2022-05-20 DIAGNOSIS — G4730 Sleep apnea, unspecified: Secondary | ICD-10-CM | POA: Diagnosis not present

## 2022-05-20 DIAGNOSIS — E119 Type 2 diabetes mellitus without complications: Secondary | ICD-10-CM | POA: Diagnosis not present

## 2022-05-20 LAB — TSH: TSH: 0.02 — AB (ref 0.41–5.90)

## 2022-07-14 DIAGNOSIS — E039 Hypothyroidism, unspecified: Secondary | ICD-10-CM | POA: Diagnosis not present

## 2022-07-14 LAB — TSH: TSH: 0.01 — AB (ref 0.41–5.90)

## 2022-08-29 DIAGNOSIS — Z6841 Body Mass Index (BMI) 40.0 and over, adult: Secondary | ICD-10-CM | POA: Diagnosis not present

## 2022-08-29 DIAGNOSIS — E039 Hypothyroidism, unspecified: Secondary | ICD-10-CM | POA: Diagnosis not present

## 2022-08-29 LAB — TSH: TSH: 0.01 — AB (ref 0.41–5.90)

## 2022-11-24 NOTE — Patient Instructions (Signed)
Thyroid-Stimulating Hormone Test Why am I having this test? The thyroid is a gland in the lower front of the neck. It makes hormones that affect many body parts and systems, including the system that affects how quickly the body burns fuel for energy (metabolism). The pituitary gland is located just below the brain, behind the eyes and nasal passages. It helps maintain thyroid hormone levels and thyroid gland function. You may have a thyroid-stimulating hormone (TSH) test if you have possible symptoms of abnormal thyroid hormone levels. This test can help your health care provider: Diagnose a disorder of the thyroid gland or pituitary gland. Manage your condition and treatment if you have an underactive thyroid (hypothyroidism) or an overactive thyroid (hyperthyroidism). Newborn babies may have this test done to screen for hypothyroidism that is present at birth (congenital). What is being tested? This test measures the amount of TSH in your blood. TSH may also be called thyrotropin. When the thyroid does not make enough hormones, the pituitary gland releases TSH into the bloodstream to stimulate the thyroid gland to make more hormones. What kind of sample is taken?     A blood sample is required for this test. It is usually collected by inserting a needle into a blood vessel. For newborns, a small amount of blood may be collected from the umbilical cord, or by using a small needle to prick the baby's heel (heel stick). Tell a health care provider about: All medicines you are taking, including vitamins, herbs, eye drops, creams, and over-the-counter medicines. Any bleeding problems you have. Any surgeries you have had. Any medical conditions you have. Whether you are pregnant or may be pregnant. How are the results reported? Your test results will be reported as a value that indicates how much TSH is in your blood. Your health care provider will compare your results to normal ranges that were  established after testing a large group of people (reference ranges). Reference ranges may vary among labs and hospitals. For this test, common reference ranges are: Adult: 2-10 microunits/mL or 2-10 milliunits/L. Newborn: Heel stick: 3-18 microunits/mL or 3-18 milliunits/L. Umbilical cord: 3-12 microunits/mL or 3-12 milliunits/L. What do the results mean? Results that are within the reference range are considered normal. This means that you have a normal amount of TSH in your blood. Results that are higher than the reference range mean that your TSH levels are too high. This may mean: Your thyroid gland is not making enough thyroid hormones. Your thyroid medicine dosage is too low. You have a tumor on your pituitary gland. This is rare. Results that are lower than the reference range mean that your TSH levels are too low. This may be caused by hyperthyroidism or by a problem with the pituitary gland function. Talk with your health care provider about what your results mean. Questions to ask your health care provider Ask your health care provider, or the department that is doing the test: When will my results be ready? How will I get my results? What are my treatment options? What other tests do I need? What are my next steps? Summary You may have a thyroid-stimulating hormone (TSH) test if you have possible symptoms of abnormal thyroid hormone levels. The thyroid is a gland in the lower front of the neck. It makes hormones that affect many body parts and systems. The pituitary gland is located just below the brain, behind the eyes and nasal passages. It helps maintain thyroid hormone levels and thyroid gland function. This test   measures the amount of TSH in your blood. TSH is made by the pituitary gland. It may also be called thyrotropin. This information is not intended to replace advice given to you by your health care provider. Make sure you discuss any questions you have with your  health care provider. Document Revised: 02/02/2021 Document Reviewed: 02/02/2021 Elsevier Patient Education  2024 [employer]  

## 2022-11-28 ENCOUNTER — Ambulatory Visit (INDEPENDENT_AMBULATORY_CARE_PROVIDER_SITE_OTHER): Payer: Medicare Other | Admitting: Nurse Practitioner

## 2022-11-28 ENCOUNTER — Encounter: Payer: Self-pay | Admitting: Nurse Practitioner

## 2022-11-28 VITALS — BP 136/82 | HR 98 | Ht 65.0 in | Wt 258.4 lb

## 2022-11-28 DIAGNOSIS — R5382 Chronic fatigue, unspecified: Secondary | ICD-10-CM | POA: Diagnosis not present

## 2022-11-28 DIAGNOSIS — E0590 Thyrotoxicosis, unspecified without thyrotoxic crisis or storm: Secondary | ICD-10-CM

## 2022-11-28 DIAGNOSIS — R7989 Other specified abnormal findings of blood chemistry: Secondary | ICD-10-CM

## 2022-11-28 NOTE — Progress Notes (Signed)
11/28/2022     Endocrinology Consult Note    Subjective:    Patient ID: [name], female    DOB: 1950-11-16, PCP Assunta Found, MD.   Past Medical History:  Diagnosis Date   Arthritis    Hypertension    Lumbar disc herniation    hx. of. surgery 2'15   PAF (paroxysmal atrial fibrillation) (HCC)    a. diagnosed during admission in 12/2020 and spontaneously converted back to NSR    Past Surgical History:  Procedure Laterality Date   ABDOMINAL HYSTERECTOMY     HEMI-MICRODISCECTOMY LUMBAR LAMINECTOMY LEVEL 1 Left 04/04/2013   Procedure: HEMI-  LAMINECTOMY MICRODISCECTOMY OF L5 -S1 ON LEFT  ;  Surgeon: Jacki Cones, MD;  Location: [facility];  Service: Orthopedics;  Laterality: Left;   JOINT REPLACEMENT Right 2012   knee   JOINT REPLACEMENT Right 2010   hip   TOTAL HIP ARTHROPLASTY Left 09/24/2013   Procedure: LEFT TOTAL HIP ARTHROPLASTY ANTERIOR APPROACH;  Surgeon: Shelda Pal, MD;  Location: [facility];  Service: Orthopedics;  Laterality: Left;    Social History   Socioeconomic History   Marital status: Married    Spouse name: Not on file   Number of children: Not on file   Years of education: Not on file   Highest education level: Not on file  Occupational History   Not on file  Tobacco Use   Smoking status: Never   Smokeless tobacco: Never  Vaping Use   Vaping status: Never Used  Substance and Sexual Activity   Alcohol use: No   Drug use: No   Sexual activity: Yes  Other Topics Concern   Not on file  Social History Narrative   Not on file   Social Determinants of Health   Financial Resource Strain: Not on file  Food Insecurity: Not on file  Transportation Needs: Not on file  Physical Activity: Not on file  Stress: Not on file  Social Connections: Not on file    History reviewed. No pertinent family history.  Outpatient Encounter Medications as of 11/28/2022  Medication Sig   apixaban (ELIQUIS) 5 MG TABS tablet Take 1 tablet (5 mg  total) by mouth 2 (two) times daily.   diltiazem (CARDIZEM CD) 240 MG 24 hr capsule Take 240 mg by mouth at bedtime.   furosemide (LASIX) 20 MG tablet Take 1 tablet (20 mg total) by mouth daily.   metFORMIN (GLUCOPHAGE) 500 MG tablet Take 500 mg by mouth 2 (two) times daily.   metoprolol tartrate (LOPRESSOR) 25 MG tablet Take 1 tablet (25 mg total) by mouth 2 (two) times daily.   levothyroxine (SYNTHROID) 75 MCG tablet Take 75 mcg by mouth daily. (Patient not taking: Reported on 11/28/2022)   No facility-administered encounter medications on file as of 11/28/2022.    ALLERGIES: No Known Allergies  VACCINATION STATUS: Immunization History  Administered Date(s) Administered   Fluad Quad(high Dose 65+) 02/04/2021     HPI  [name] is 72 y.o. female who presents today with a medical history as above. she is being seen in consultation for hyperthyroidism requested by Assunta Found, MD.   Initially she was started on Levothyroxine over a year ago for hypothyroidism, with gradual increases in Levothyroxine dosage (highest being 75 mcg).  She has since been tapered back off completely due to suppressed TSH. she has been dealing with symptoms of weight loss (attributes this to Metformin), alternating constipation/diarrhea, fatigue, dry skin, joint aches and  heat intolerance since about April 2024. These symptoms are progressively worsening and troubling to her.  her most recent thyroid labs revealed suppressed TSH of 0.011 on 08/29/22 at which time her Levothyroxine was completely stopped.  she denies dysphagia, choking, shortness of breath, no recent voice change.    she does have family history of thyroid dysfunction in her aunt (goiter), but denies family hx of thyroid cancer. she denies personal history of goiter. she is not on any anti-thyroid medications nor on any thyroid hormone supplements. Denies use of Biotin containing supplements.  she is willing to proceed with appropriate work  up and therapy for thyrotoxicosis.   Review of systems  Constitutional: +decreasing body weight, current Body mass index is 43 kg/m., no fatigue, + subjective hyperthermia, no subjective hypothermia Eyes: no blurry vision, no xerophthalmia ENT: no sore throat, no nodules palpated in throat, no dysphagia/odynophagia, no hoarseness Cardiovascular: no chest pain, no shortness of breath, no palpitations, no leg swelling Respiratory: no cough, no shortness of breath Gastrointestinal: no nausea/vomiting, alternating constipation/diarrhea Musculoskeletal: + diffuse muscle/joint aches-walks with cane Skin: no rashes, no hyperemia Neurological: no tremors, no numbness, no tingling, no dizziness Psychiatric: no depression, no anxiety   Objective:    BP 136/82 (BP Location: Right Arm, Patient Position: Sitting, Cuff Size: Large)   Pulse 98   Ht 5\' 5"  (1.651 m)   Wt 258 lb 6.4 oz (117.2 kg)   BMI 43.00 kg/m   Wt Readings from Last 3 Encounters:  11/28/22 258 lb 6.4 oz (117.2 kg)  05/18/22 271 lb (122.9 kg)  02/04/21 300 lb (136.1 kg)     BP Readings from Last 3 Encounters:  11/28/22 136/82  05/18/22 102/60  12/30/21 (!) 180/83                          Physical Exam- Limited  Constitutional:  Body mass index is 43 kg/m. , not in acute distress, mildly anxious state of mind Eyes:  EOMI, no exophthalmos Neck: Supple Thyroid: No gross goiter Cardiovascular: RRR, no murmurs, rubs, or gallops, no edema Respiratory: Adequate breathing efforts, no crackles, rales, rhonchi, or wheezing Musculoskeletal: no gross deformities, strength intact in all four extremities, no gross restriction of joint movements Skin:  no rashes, no hyperemia Neurological: slight tremor with outstretched hands L>R, DTR normal in BLE   CMP     Component Value Date/Time   NA 133 (L) 01/01/2021 0257   K 4.0 01/01/2021 0257   CL 100 01/01/2021 0257   CO2 21 (L) 01/01/2021 0257   GLUCOSE 298 (H) 01/01/2021  0257   BUN 17 01/01/2021 0257   CREATININE 0.77 01/01/2021 0257   CALCIUM 9.0 01/01/2021 0257   PROT 7.8 01/01/2021 0257   ALBUMIN 3.9 01/01/2021 0257   AST 51 (H) 01/01/2021 0257   ALT 48 (H) 01/01/2021 0257   ALKPHOS 80 01/01/2021 0257   BILITOT 0.7 01/01/2021 0257   GFRNONAA >60 01/01/2021 0257     CBC    Component Value Date/Time   WBC 13.6 (H) 01/01/2021 0257   RBC 5.41 (H) 01/01/2021 0257   HGB 15.2 (H) 01/01/2021 0257   HCT 47.0 (H) 01/01/2021 0257   PLT 255 01/01/2021 0257   MCV 86.9 01/01/2021 0257   MCH 28.1 01/01/2021 0257   MCHC 32.3 01/01/2021 0257   RDW 14.6 01/01/2021 0257   LYMPHSABS 2.3 11/25/2010 1430   MONOABS 0.6 11/25/2010 1430   EOSABS 0.2 11/25/2010 1430  BASOSABS 0.0 11/25/2010 1430     Diabetic Labs (most recent): Lab Results  Component Value Date   HGBA1C 7.4 (H) 01/01/2021    Lipid Panel  No results found for: "CHOL", "TRIG", "HDL", "CHOLHDL", "VLDL", "LDLCALC", "LDLDIRECT", "LABVLDL"   Lab Results  Component Value Date   TSH 0.01 (A) 08/29/2022   TSH 0.01 (A) 07/14/2022   TSH 0.02 (A) 05/20/2022   TSH 2.74 01/19/2022   TSH 10.791 (H) 01/01/2021        Assessment & Plan:   1. Abnormal TSH 2. Hyperthyroidism  she is being seen at a kind request of Assunta Found, MD.  her history and most recent labs are reviewed, and she was examined clinically. Subjective and objective findings are inconsistent with thyrotoxicosis from primary hyperthyroidism. The potential risks of untreated thyrotoxicosis and the need for definitive therapy have been discussed in detail with her, and she agrees to proceed with diagnostic workup and treatment plan.   I will repeat full profile thyroid function tests today (including antibody testing to assess for autoimmune thyroid dysfunction).  Will wait to order confirmatory thyroid uptake and scan until after preliminary lab results return.   Options of therapy are discussed with her.  We discussed the  option of treating it with medications including methimazole or PTU which may have side effects including rash, transaminitis, and bone marrow suppression.  We also discussed the option of definitive therapy with RAI ablation of the thyroid. If she is found to have primary hyperthyroidism from Graves' disease , toxic multinodular goiter or toxic nodular goiter the preferred modality of treatment would be I-131 thyroid ablation. Surgery is another choice of treatment in some cases, in her case surgery is not a good fit for presentation with only mild goiter.  -Patient is made aware of the high likelihood of post ablative hypothyroidism with subsequent need for lifelong thyroid hormone replacement. sheunderstands this outcome and she is  willing to proceed.        -Patient is advised to maintain close follow up with Assunta Found, MD for primary care needs.   - Time spent with the patient: 45 minutes, of which >50% was spent in obtaining information about her symptoms, reviewing her previous labs, evaluations, and treatments, counseling her about her hyperthyroidism , and developing a plan to confirm the diagnosis and long term treatment as necessary. Please refer to "Patient Self Inventory" in the Media tab for reviewed elements of pertinent patient history.  Cecile Hearing Kilts participated in the discussions, expressed understanding, and voiced agreement with the above plans.  All questions were answered to her satisfaction. she is encouraged to contact clinic should she have any questions or concerns prior to her return visit.   Follow up plan: Return will call with thyroid lab results and plan moving forward, for Thyroid follow up, Previsit labs.   Thank you for involving me in the care of this pleasant patient, and I will continue to update you with her progress.    Ronny Bacon, [facility] [address] [place], [place] [address] Phone:  [phone] Fax: [phone]  11/28/2022, 1:44 PM

## 2022-11-29 ENCOUNTER — Telehealth: Payer: Self-pay | Admitting: *Deleted

## 2022-11-29 DIAGNOSIS — E0590 Thyrotoxicosis, unspecified without thyrotoxic crisis or storm: Secondary | ICD-10-CM

## 2022-11-29 DIAGNOSIS — R768 Other specified abnormal immunological findings in serum: Secondary | ICD-10-CM

## 2022-11-29 LAB — TSH: TSH: 0.006 u[IU]/mL — ABNORMAL LOW (ref 0.450–4.500)

## 2022-11-29 LAB — T3, FREE: T3, Free: 5.8 pg/mL — ABNORMAL HIGH (ref 2.0–4.4)

## 2022-11-29 LAB — THYROID PEROXIDASE ANTIBODY: Thyroperoxidase Ab SerPl-aCnc: 40 [IU]/mL — ABNORMAL HIGH (ref 0–34)

## 2022-11-29 LAB — THYROGLOBULIN ANTIBODY: Thyroglobulin Antibody: <1 [IU]/mL (ref 0.0–0.9)

## 2022-11-29 LAB — T4, FREE: Free T4: 1.85 ng/dL — ABNORMAL HIGH (ref 0.82–1.77)

## 2022-11-29 NOTE — Telephone Encounter (Signed)
I put the order in.  I will reach back out once I get the results.

## 2022-11-29 NOTE — Telephone Encounter (Signed)
Patient was called and results were given per Ronny Bacon, NP. Patient is in agreement to move forward with uptake and scan. She shares that [facility] the [facility] would be done.  Patient was made aware that [facility] would call her to make the appointment.

## 2022-11-29 NOTE — Telephone Encounter (Signed)
Noted and the patient is aware.

## 2022-11-29 NOTE — Telephone Encounter (Signed)
-----   Message from Dani Gobble sent at 11/29/2022  7:50 AM EDT ----- Please call patient and let her know that her thyroid labs are still suggestive that her thyroid is making too much thyroid hormone.  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  Her thyroid is preprogrammed by her genetics to attack her thyroid.  We need to proceed with additional imaging to see how much of the thyroid is overactive (uptake and scan).  The goal is to see if the entire gland is over-active or if it is a nodule that is overactive.  We talked about it briefly during the consultation.  Is she ok to proceed with that test?  If so, I will go ahead and order it and reach back out once I review the results.  Her thyroid is not SO overactive that we need to start treatment to slow it down immediately but I do want to move forward quickly to prevent complications from an over-active thyroid.

## 2022-11-29 NOTE — Progress Notes (Signed)
Please call patient and let her know that her thyroid labs are still suggestive that her thyroid is making too much thyroid hormone.  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  Her thyroid is preprogrammed by her genetics to attack her thyroid.  We need to proceed with additional imaging to see how much of the thyroid is overactive (uptake and scan).  The goal is to see if the entire gland is over-active or if it is a nodule that is overactive.  We talked about it briefly during the consultation.  Is she ok to proceed with that test?  If so, I will go ahead and order it and reach back out once I review the results.  Her thyroid is not SO overactive that we need to start treatment to slow it down immediately but I do want to move forward quickly to prevent complications from an over-active thyroid.

## 2022-12-08 ENCOUNTER — Ambulatory Visit (HOSPITAL_COMMUNITY): Admission: RE | Admit: 2022-12-08 | Payer: Medicare Other | Source: Ambulatory Visit

## 2022-12-08 ENCOUNTER — Ambulatory Visit (HOSPITAL_COMMUNITY): Payer: Medicare Other

## 2022-12-09 ENCOUNTER — Ambulatory Visit (HOSPITAL_COMMUNITY): Payer: Medicare Other

## 2022-12-14 ENCOUNTER — Telehealth: Payer: Self-pay | Admitting: Nurse Practitioner

## 2022-12-14 DIAGNOSIS — E0590 Thyrotoxicosis, unspecified without thyrotoxic crisis or storm: Secondary | ICD-10-CM

## 2022-12-14 DIAGNOSIS — R768 Other specified abnormal immunological findings in serum: Secondary | ICD-10-CM

## 2022-12-14 MED ORDER — METHIMAZOLE 5 MG PO TABS: 5.0000 mg | ORAL_TABLET | Freq: Two times a day (BID) | ORAL | 1 refills | Status: DC

## 2022-12-14 NOTE — Telephone Encounter (Signed)
Patient said she does not when can reschedule her uptake and scan due to her husband having tremors. She said she would reschedule it just not sure when. I told her that you said you may want to put her on medication please advise

## 2022-12-14 NOTE — Telephone Encounter (Signed)
Pt notified and agrees. She will do labs at the end of Dec 2024

## 2022-12-14 NOTE — Telephone Encounter (Signed)
Yes I need to put her on Methimazole to slow her thyroid down in the interim.  If we leave her thyroid over-active it will increase her risk of thyroid storm (a medical emergency).  I will send in for Methimazole 5 mg po twice daily for now.  I recommend repeating labs in 2 months to see if the medication is working.  I put in orders.

## 2022-12-20 ENCOUNTER — Other Ambulatory Visit (HOSPITAL_COMMUNITY): Payer: Medicare Other

## 2022-12-21 ENCOUNTER — Other Ambulatory Visit (HOSPITAL_COMMUNITY): Payer: Medicare Other

## 2023-03-21 DIAGNOSIS — E0590 Thyrotoxicosis, unspecified without thyrotoxic crisis or storm: Secondary | ICD-10-CM | POA: Diagnosis not present

## 2023-03-21 DIAGNOSIS — R768 Other specified abnormal immunological findings in serum: Secondary | ICD-10-CM | POA: Diagnosis not present

## 2023-03-22 ENCOUNTER — Other Ambulatory Visit: Payer: Self-pay | Admitting: Nurse Practitioner

## 2023-03-22 ENCOUNTER — Telehealth: Payer: Self-pay | Admitting: *Deleted

## 2023-03-22 DIAGNOSIS — R768 Other specified abnormal immunological findings in serum: Secondary | ICD-10-CM

## 2023-03-22 DIAGNOSIS — E0590 Thyrotoxicosis, unspecified without thyrotoxic crisis or storm: Secondary | ICD-10-CM

## 2023-03-22 LAB — T4, FREE: Free T4: 0.87 ng/dL (ref 0.82–1.77)

## 2023-03-22 LAB — TSH: TSH: 10.1 u[IU]/mL — ABNORMAL HIGH (ref 0.450–4.500)

## 2023-03-22 LAB — T3, FREE: T3, Free: 3.4 pg/mL (ref 2.0–4.4)

## 2023-03-22 NOTE — Progress Notes (Signed)
 I just got her thyroid  labs back and the Methimazole  is working well to slow the thyroid , so much so that we need to reduce the dose.  Have her lower the Methimazole  to 5 mg po daily.  Will need to repeat labs in 2 months and have follow up in office.  I will enter orders and attach front office ladies as well.

## 2023-03-22 NOTE — Progress Notes (Signed)
 Patient was called and made aware.

## 2023-03-22 NOTE — Telephone Encounter (Signed)
-----   Message from Benton JINNY Rio sent at 03/22/2023  6:59 AM EST ----- I just got her thyroid  labs back and the Methimazole  is working well to slow the thyroid , so much so that we need to reduce the dose.  Have her lower the Methimazole  to 5 mg po daily.  Will need to repeat labs in 2 months and have follow up in office.  I will enter orders and attach front office ladies as well.

## 2023-03-22 NOTE — Telephone Encounter (Signed)
 Patient was called and made aware.

## 2023-03-30 ENCOUNTER — Telehealth: Payer: Self-pay | Admitting: Cardiology

## 2023-03-30 ENCOUNTER — Encounter (HOSPITAL_COMMUNITY): Payer: Self-pay

## 2023-03-30 ENCOUNTER — Ambulatory Visit: Payer: Medicare Other | Admitting: Student

## 2023-03-30 ENCOUNTER — Observation Stay (HOSPITAL_COMMUNITY)
Admission: EM | Admit: 2023-03-30 | Discharge: 2023-03-31 | Disposition: A | Discharge status: Home / Self Care | Payer: Medicare Other | Attending: Internal Medicine | Admitting: Internal Medicine

## 2023-03-30 ENCOUNTER — Other Ambulatory Visit: Payer: Self-pay

## 2023-03-30 ENCOUNTER — Emergency Department (HOSPITAL_COMMUNITY): Payer: Medicare Other

## 2023-03-30 DIAGNOSIS — E039 Hypothyroidism, unspecified: Secondary | ICD-10-CM | POA: Insufficient documentation

## 2023-03-30 DIAGNOSIS — R946 Abnormal results of thyroid function studies: Secondary | ICD-10-CM | POA: Insufficient documentation

## 2023-03-30 DIAGNOSIS — I110 Hypertensive heart disease with heart failure: Secondary | ICD-10-CM | POA: Diagnosis not present

## 2023-03-30 DIAGNOSIS — Z7901 Long term (current) use of anticoagulants: Secondary | ICD-10-CM | POA: Diagnosis not present

## 2023-03-30 DIAGNOSIS — I480 Paroxysmal atrial fibrillation: Principal | ICD-10-CM | POA: Insufficient documentation

## 2023-03-30 DIAGNOSIS — Z79899 Other long term (current) drug therapy: Secondary | ICD-10-CM | POA: Insufficient documentation

## 2023-03-30 DIAGNOSIS — Z96651 Presence of right artificial knee joint: Secondary | ICD-10-CM | POA: Diagnosis not present

## 2023-03-30 DIAGNOSIS — I10 Essential (primary) hypertension: Secondary | ICD-10-CM | POA: Diagnosis not present

## 2023-03-30 DIAGNOSIS — R000 Tachycardia, unspecified: Secondary | ICD-10-CM | POA: Diagnosis not present

## 2023-03-30 DIAGNOSIS — I5032 Chronic diastolic (congestive) heart failure: Secondary | ICD-10-CM | POA: Diagnosis not present

## 2023-03-30 DIAGNOSIS — I4891 Unspecified atrial fibrillation: Principal | ICD-10-CM | POA: Diagnosis present

## 2023-03-30 DIAGNOSIS — D72829 Elevated white blood cell count, unspecified: Secondary | ICD-10-CM | POA: Diagnosis not present

## 2023-03-30 DIAGNOSIS — Z96643 Presence of artificial hip joint, bilateral: Secondary | ICD-10-CM | POA: Diagnosis not present

## 2023-03-30 DIAGNOSIS — R7989 Other specified abnormal findings of blood chemistry: Secondary | ICD-10-CM | POA: Diagnosis not present

## 2023-03-30 DIAGNOSIS — R918 Other nonspecific abnormal finding of lung field: Secondary | ICD-10-CM | POA: Diagnosis not present

## 2023-03-30 DIAGNOSIS — R079 Chest pain, unspecified: Secondary | ICD-10-CM | POA: Diagnosis not present

## 2023-03-30 DIAGNOSIS — E119 Type 2 diabetes mellitus without complications: Secondary | ICD-10-CM | POA: Diagnosis not present

## 2023-03-30 DIAGNOSIS — R0989 Other specified symptoms and signs involving the circulatory and respiratory systems: Secondary | ICD-10-CM | POA: Diagnosis not present

## 2023-03-30 HISTORY — DX: Type 2 diabetes mellitus without complications: E11.9

## 2023-03-30 HISTORY — DX: Heart failure, unspecified: I50.9

## 2023-03-30 LAB — CBC WITH DIFFERENTIAL/PLATELET
Abs Immature Granulocytes: 0.04 10*3/uL (ref 0.00–0.07)
Basophils Absolute: 0.1 10*3/uL (ref 0.0–0.1)
Basophils Relative: 1 %
Eosinophils Absolute: 0.1 10*3/uL (ref 0.0–0.5)
Eosinophils Relative: 1 %
HCT: 43.9 % (ref 36.0–46.0)
Hemoglobin: 14.2 g/dL (ref 12.0–15.0)
Immature Granulocytes: 0 %
Lymphocytes Relative: 15 %
Lymphs Abs: 2 10*3/uL (ref 0.7–4.0)
MCH: 26.5 pg (ref 26.0–34.0)
MCHC: 32.3 g/dL (ref 30.0–36.0)
MCV: 81.9 fL (ref 80.0–100.0)
Monocytes Absolute: 1.2 10*3/uL — ABNORMAL HIGH (ref 0.1–1.0)
Monocytes Relative: 9 %
Neutro Abs: 9.9 10*3/uL — ABNORMAL HIGH (ref 1.7–7.7)
Neutrophils Relative %: 74 %
Platelets: 354 10*3/uL (ref 150–400)
RBC: 5.36 MIL/uL — ABNORMAL HIGH (ref 3.87–5.11)
RDW: 17 % — ABNORMAL HIGH (ref 11.5–15.5)
WBC: 13.2 10*3/uL — ABNORMAL HIGH (ref 4.0–10.5)
nRBC: 0 % (ref 0.0–0.2)

## 2023-03-30 LAB — TROPONIN I (HIGH SENSITIVITY)
Troponin I (High Sensitivity): 29 ng/L — ABNORMAL HIGH (ref <18)
Troponin I (High Sensitivity): 31 ng/L — ABNORMAL HIGH (ref <18)

## 2023-03-30 LAB — COMPREHENSIVE METABOLIC PANEL
ALT: 17 U/L (ref 0–44)
AST: 24 U/L (ref 15–41)
Albumin: 4.2 g/dL (ref 3.5–5.0)
Alkaline Phosphatase: 82 U/L (ref 38–126)
Anion gap: 12 (ref 5–15)
BUN: 13 mg/dL (ref 8–23)
CO2: 22 mmol/L (ref 22–32)
Calcium: 9 mg/dL (ref 8.9–10.3)
Chloride: 100 mmol/L (ref 98–111)
Creatinine, Ser: 0.76 mg/dL (ref 0.44–1.00)
GFR, Estimated: >60 mL/min (ref >60)
Glucose, Bld: 112 mg/dL — ABNORMAL HIGH (ref 70–99)
Potassium: 3.8 mmol/L (ref 3.5–5.1)
Sodium: 134 mmol/L — ABNORMAL LOW (ref 135–145)
Total Bilirubin: 0.7 mg/dL (ref 0.0–1.2)
Total Protein: 8 g/dL (ref 6.5–8.1)

## 2023-03-30 LAB — TSH: TSH: 9.739 u[IU]/mL — ABNORMAL HIGH (ref 0.350–4.500)

## 2023-03-30 LAB — MRSA NEXT GEN BY PCR, NASAL: MRSA by PCR Next Gen: NOT DETECTED

## 2023-03-30 LAB — BRAIN NATRIURETIC PEPTIDE: B Natriuretic Peptide: 443 pg/mL — ABNORMAL HIGH (ref 0.0–100.0)

## 2023-03-30 LAB — T4, FREE: Free T4: 0.85 ng/dL (ref 0.61–1.12)

## 2023-03-30 MED ORDER — METOPROLOL TARTRATE 25 MG PO TABS
25.0000 mg | ORAL_TABLET | Freq: Two times a day (BID) | ORAL | Status: DC
  Administered 2023-03-30 – 2023-03-31 (×2): 25 mg via ORAL
  Filled 2023-03-30 (×2): qty 1

## 2023-03-30 MED ORDER — OXYMETAZOLINE HCL 0.05 % NA SOLN
1.0000 | Freq: Two times a day (BID) | NASAL | Status: DC
  Administered 2023-03-30 (×2): 1 via NASAL
  Filled 2023-03-30: qty 15

## 2023-03-30 MED ORDER — DILTIAZEM HCL-DEXTROSE 125-5 MG/125ML-% IV SOLN (PREMIX)
5.0000 mg/h | INTRAVENOUS | Status: DC
  Administered 2023-03-30: 15 mg/h via INTRAVENOUS
  Administered 2023-03-30: 5 mg/h via INTRAVENOUS
  Filled 2023-03-30 (×3): qty 125

## 2023-03-30 MED ORDER — ONDANSETRON HCL 4 MG PO TABS
4.0000 mg | ORAL_TABLET | Freq: Four times a day (QID) | ORAL | Status: DC | PRN
Start: 2023-03-30 — End: 2023-03-31

## 2023-03-30 MED ORDER — ACETAMINOPHEN 650 MG RE SUPP: 650.0000 mg | Freq: Four times a day (QID) | RECTAL | Status: DC | PRN

## 2023-03-30 MED ORDER — DILTIAZEM LOAD VIA INFUSION
10.0000 mg | Freq: Once | INTRAVENOUS | Status: AC
  Administered 2023-03-30: 10 mg via INTRAVENOUS
  Filled 2023-03-30: qty 10

## 2023-03-30 MED ORDER — SODIUM CHLORIDE 0.9 % IV BOLUS
1000.0000 mL | Freq: Once | INTRAVENOUS | Status: AC
  Administered 2023-03-30: 1000 mL via INTRAVENOUS

## 2023-03-30 MED ORDER — SALINE SPRAY 0.65 % NA SOLN
1.0000 | NASAL | Status: DC | PRN
  Administered 2023-03-30: 1 via NASAL
  Filled 2023-03-30: qty 44

## 2023-03-30 MED ORDER — METHIMAZOLE 5 MG PO TABS: 5.0000 mg | ORAL_TABLET | Freq: Every day | ORAL | Status: DC

## 2023-03-30 MED ORDER — CHLORHEXIDINE GLUCONATE CLOTH 2 % EX PADS
6.0000 | MEDICATED_PAD | Freq: Every day | CUTANEOUS | Status: DC
Start: 2023-03-31 — End: 2023-03-31
  Administered 2023-03-31: 6 via TOPICAL

## 2023-03-30 MED ORDER — SODIUM CHLORIDE 0.9% FLUSH
3.0000 mL | Freq: Two times a day (BID) | INTRAVENOUS | Status: DC
  Administered 2023-03-30 – 2023-03-31 (×2): 3 mL via INTRAVENOUS

## 2023-03-30 MED ORDER — METOPROLOL TARTRATE 5 MG/5ML IV SOLN
2.5000 mg | Freq: Three times a day (TID) | INTRAVENOUS | Status: DC | PRN
  Administered 2023-03-30 – 2023-03-31 (×2): 2.5 mg via INTRAVENOUS
  Filled 2023-03-30: qty 5

## 2023-03-30 MED ORDER — DIGOXIN 0.25 MG/ML IJ SOLN
0.5000 mg | Freq: Once | INTRAMUSCULAR | Status: AC
  Administered 2023-03-30: 0.5 mg via INTRAVENOUS
  Filled 2023-03-30: qty 2

## 2023-03-30 MED ORDER — APIXABAN 5 MG PO TABS
5.0000 mg | ORAL_TABLET | Freq: Two times a day (BID) | ORAL | Status: DC
  Administered 2023-03-30 – 2023-03-31 (×2): 5 mg via ORAL
  Filled 2023-03-30 (×2): qty 1

## 2023-03-30 MED ORDER — ACETAMINOPHEN 325 MG PO TABS: 650.0000 mg | ORAL_TABLET | Freq: Four times a day (QID) | ORAL | Status: DC | PRN

## 2023-03-30 MED ORDER — ONDANSETRON HCL 4 MG/2ML IJ SOLN: 4.0000 mg | Freq: Four times a day (QID) | INTRAMUSCULAR | Status: DC | PRN

## 2023-03-30 MED ORDER — DIGOXIN 0.25 MG/ML IJ SOLN
0.2500 mg | Freq: Four times a day (QID) | INTRAMUSCULAR | Status: AC
Start: 2023-03-30 — End: 2023-03-31
  Administered 2023-03-30 – 2023-03-31 (×2): 0.25 mg via INTRAVENOUS
  Filled 2023-03-30 (×2): qty 2

## 2023-03-30 NOTE — Telephone Encounter (Signed)
   I strongly agree with Emergency Department evaluation because if her heart rate is still in the 150's, there is little that we would be able to do in the office for her. She will likely need a cardioversion if she continues to remain tachycardic. Please let her know that if heart rate is still in the 150's, we will likely need to send her to the Emergency Department.  Has she already taken her medications for today? Looks like she takes Cardizem CD 240 mg at bedtime and Lopressor 25 mg twice daily. She could try taking extra 25 mg of Lopressor later this morning as long as BP is stable (SBP > 110).   Signed, Ellsworth Lennox, PA-C 03/30/2023, 9:23 AM

## 2023-03-30 NOTE — ED Notes (Signed)
ED TO INPATIENT HANDOFF REPORT  ED Nurse Name and Phone #: Bonita Quin [phone]  S Name/Age/Gender Cecile Hearing Witherow 73 y.o. female Room/Bed: APA03/APA03  Code Status   Code Status: Prior  Home/SNF/Other Home Patient oriented to: self, place, time, and situation Is this baseline? Yes   Triage Complete: Triage complete  Chief Complaint Atrial fibrillation with rapid ventricular response (HCC) [I48.91]  Triage Note Pt arrived via PV c/o elevated HR at home. Pt reports calling her PCP and was advised to seek treatment in the ER. Pt reports HR has been in the 150's.    Allergies No Known Allergies  Level of Care/Admitting Diagnosis ED Disposition     ED Disposition  Admit   Condition  --   Comment  Hospital Area: [facility] [[id number]]  Level of Care: Stepdown [14]  Covid Evaluation: Asymptomatic - no recent exposure (last 10 days) testing not required  Diagnosis: Atrial fibrillation with rapid ventricular response [facility]) [[id number]]  Admitting Physician: Tyrone Nine [[id number]  Attending Physician: Tyrone Nine [phone]          B Medical/Surgery History Past Medical History:  Diagnosis Date   Arthritis    CHF (congestive heart failure) (HCC)    Diabetes mellitus without complication (HCC)    Hypertension    Lumbar disc herniation    hx. of. surgery 2'15   PAF (paroxysmal atrial fibrillation) (HCC)    a. diagnosed during admission in 12/2020 and spontaneously converted back to NSR   Past Surgical History:  Procedure Laterality Date   ABDOMINAL HYSTERECTOMY     HEMI-MICRODISCECTOMY LUMBAR LAMINECTOMY LEVEL 1 Left 04/04/2013   Procedure: HEMI-  LAMINECTOMY MICRODISCECTOMY OF L5 -S1 ON LEFT  ;  Surgeon: Jacki Cones, MD;  Location: [facility];  Service: Orthopedics;  Laterality: Left;   JOINT REPLACEMENT Right 2012   knee   JOINT REPLACEMENT Right 2010   hip   TOTAL HIP ARTHROPLASTY Left 09/24/2013   Procedure: LEFT TOTAL HIP ARTHROPLASTY ANTERIOR APPROACH;   Surgeon: Shelda Pal, MD;  Location: [facility];  Service: Orthopedics;  Laterality: Left;     A IV Location/Drains/Wounds Patient Lines/Drains/Airways Status     Active Line/Drains/Airways     Name Placement date Placement time Site Days   Peripheral IV 03/30/23 20 G Anterior;Proximal;Right Forearm 03/30/23  1101  Forearm  less than 1            Intake/Output Last 24 hours No intake or output data in the 24 hours ending 03/30/23 1317  Labs/Imaging Results for orders placed or performed during the hospital encounter of 03/30/23 (from the past 48 hours)  Troponin I (High Sensitivity)     Status: Abnormal   Collection Time: 03/30/23 11:05 AM  Result Value Ref Range   Troponin I (High Sensitivity) 29 (H) <18 ng/L    Comment: (NOTE) Elevated high sensitivity troponin I (hsTnI) values and significant  changes across serial measurements may suggest ACS but many other  chronic and acute conditions are known to elevate hsTnI results.  Refer to the "Links" section for chest pain algorithms and additional  guidance. Performed at [facility], [address] [place], [place] [address]   Brain natriuretic peptide     Status: Abnormal   Collection Time: 03/30/23 11:05 AM  Result Value Ref Range   B Natriuretic Peptide 443.0 (H) 0.0 - 100.0 pg/mL    Comment: Performed at [facility], [address] [place], [place] [address]  CBC with Differential  Status: Abnormal   Collection Time: 03/30/23 11:05 AM  Result Value Ref Range   WBC 13.2 (H) 4.0 - 10.5 K/uL   RBC 5.36 (H) 3.87 - 5.11 MIL/uL   Hemoglobin 14.2 12.0 - 15.0 g/dL   HCT 16.1 09.6 - 04.5 %   MCV 81.9 80.0 - 100.0 fL   MCH 26.5 26.0 - 34.0 pg   MCHC 32.3 30.0 - 36.0 g/dL   RDW 40.9 (H) 81.1 - 91.4 %   Platelets 354 150 - 400 K/uL   nRBC 0.0 0.0 - 0.2 %   Neutrophils Relative % 74 %   Neutro Abs 9.9 (H) 1.7 - 7.7 K/uL   Lymphocytes Relative 15 %   Lymphs Abs 2.0 0.7 - 4.0 K/uL   Monocytes Relative 9 %   Monocytes  Absolute 1.2 (H) 0.1 - 1.0 K/uL   Eosinophils Relative 1 %   Eosinophils Absolute 0.1 0.0 - 0.5 K/uL   Basophils Relative 1 %   Basophils Absolute 0.1 0.0 - 0.1 K/uL   Immature Granulocytes 0 %   Abs Immature Granulocytes 0.04 0.00 - 0.07 K/uL    Comment: Performed at [facility], [address], [place], [place] [address]  Comprehensive metabolic panel     Status: Abnormal   Collection Time: 03/30/23 11:05 AM  Result Value Ref Range   Sodium 134 (L) 135 - 145 mmol/L   Potassium 3.8 3.5 - 5.1 mmol/L   Chloride 100 98 - 111 mmol/L   CO2 22 22 - 32 mmol/L   Glucose, Bld 112 (H) 70 - 99 mg/dL    Comment: Glucose reference range applies only to samples taken after fasting for at least 8 hours.   BUN 13 8 - 23 mg/dL   Creatinine, Ser 6.21 0.44 - 1.00 mg/dL   Calcium 9.0 8.9 - 30.8 mg/dL   Total Protein 8.0 6.5 - 8.1 g/dL   Albumin 4.2 3.5 - 5.0 g/dL   AST 24 15 - 41 U/L   ALT 17 0 - 44 U/L   Alkaline Phosphatase 82 38 - 126 U/L   Total Bilirubin 0.7 0.0 - 1.2 mg/dL   GFR, Estimated >65 >78 mL/min    Comment: (NOTE) Calculated using the CKD-EPI Creatinine Equation (2021)    Anion gap 12 5 - 15    Comment: Performed at [facility], [address], [place], [place] [address]  TSH     Status: Abnormal   Collection Time: 03/30/23 11:05 AM  Result Value Ref Range   TSH 9.739 (H) 0.350 - 4.500 uIU/mL    Comment: Performed by a 3rd Generation assay with a functional sensitivity of <=0.01 uIU/mL. Performed at [facility], [address], [place], [place] [address]    DG Chest Port 1 View Result Date: 03/30/2023 CLINICAL DATA:  Tachycardia.  Chest pain EXAM: PORTABLE CHEST 1 VIEW COMPARISON:  Chest radiograph dated 12/29/2020 FINDINGS: Low lung volumes with bronchovascular crowding. Mild bilateral interstitial opacities and bibasilar patchy opacities. No pleural effusion or pneumothorax. Similar mildly enlarged cardiomediastinal silhouette. No acute osseous abnormality. IMPRESSION: Low lung  volumes with bronchovascular crowding. Mild bilateral interstitial opacities and bibasilar patchy opacities, which may represent atelectasis, aspiration, or pneumonia. Electronically Signed   By: Agustin Cree M.D.   On: 03/30/2023 12:58    Pending Labs Unresulted Labs (From admission, onward)     Start     Ordered   03/30/23 1212  T3, free  Once,   URGENT  03/30/23 1211   03/30/23 1149  T4, free  Add-on,   AD        03/30/23 1148            Vitals/Pain Today's Vitals   03/30/23 1145 03/30/23 1200 03/30/23 1256 03/30/23 1257  BP:  (!) 123/97 (!) 127/96   Pulse: (!) 148 (!) 148 (!) 126 (!) 154  Resp: 18 (!) 22 13 14   Temp:      TempSrc:      SpO2: 96% 96% 97% 95%  Weight:      Height:      PainSc:        Isolation Precautions No active isolations  Medications Medications  diltiazem (CARDIZEM) 1 mg/mL load via infusion 10 mg (10 mg Intravenous Bolus from Bag 03/30/23 1109)    And  diltiazem (CARDIZEM) 125 mg in dextrose 5% 125 mL (1 mg/mL) infusion (10 mg/hr Intravenous Rate/Dose Change 03/30/23 1227)  sodium chloride 0.9 % bolus 1,000 mL (0 mLs Intravenous Stopped 03/30/23 1225)    Mobility walks with device     Focused Assessments Cardiac Assessment Handoff: AFIB RVR   No results found for: "CKTOTAL", "CKMB", "CKMBINDEX", "TROPONINI" No results found for: "DDIMER" Does the Patient currently have chest pain? No    R Recommendations: See Admitting Provider Note  Report given to:   Additional Notes: Bedside commode, has knee pain hard to move around, A&O x 4 , lives with husband, continent b/b, own adl's, takes po meds at home,

## 2023-03-30 NOTE — Telephone Encounter (Signed)
Spoke with pt who states that she has had palpitations since Sunday. She reports that " It feels like I'm running a race". BP at 0630 146/89 Hr 111. BP at 0800 134/115 Hr 150. Pt encouraged to be seen in the ER. Pt states that she did not want to be seen in the ER d/t high cases of the flu. Appt made for today at 1300 with B. Strader.

## 2023-03-30 NOTE — Telephone Encounter (Signed)
Pt notified of B. Strader's response and now agrees with ER evaluation.

## 2023-03-30 NOTE — Plan of Care (Signed)
  Problem: Activity: Goal: Risk for activity intolerance will decrease Outcome: Progressing   Problem: Clinical Measurements: Goal: Will remain free from infection Outcome: Progressing   Problem: Clinical Measurements: Goal: Diagnostic test results will improve Outcome: Progressing   Problem: Clinical Measurements: Goal: Cardiovascular complication will be avoided Outcome: Progressing   Problem: Clinical Measurements: Goal: Respiratory complications will improve Outcome: Progressing

## 2023-03-30 NOTE — ED Notes (Signed)
Pt reports taking her Metoprolol this morning w/o relief.

## 2023-03-30 NOTE — Telephone Encounter (Signed)
Pt c/o BP issue:  1. What are your last 5 BP readings? 134/115 pulse rate 30, this morning 146/89 pulse 111, 142/79 pulse 108, 126/83 pulse 141   2. Are you having any other symptoms (ex. Dizziness, headache, blurred vision, passed out)? Pressure in her chest and feels really tired  3. What is your medication issue? High blood pressure, and high heart rated

## 2023-03-30 NOTE — Consult Note (Addendum)
 CARDIOLOGY CONSULT NOTE    Patient ID: [name]; [id number]; 05-Nov-1950   Admit date: 03/30/2023 Date of Consult: 03/30/2023  Primary Care Provider: Assunta Found, MD Primary Cardiologist:  Primary Electrophysiologist:     History of Present Illness:   [name] is a 73 year old F known to have hypothyroidism c/w levothyroxine induced hypothyroidism, HTN, DM2 presented to the ER with palpitations.  EKG showed A-fib with RVR.  BNP mildly elevated.  Denies having SOB, chest pain, dizziness, syncope.  Past Medical History:  Diagnosis Date   Arthritis    CHF (congestive heart failure) (HCC)    Diabetes mellitus without complication (HCC)    Hypertension    Lumbar disc herniation    hx. of. surgery 2'15   PAF (paroxysmal atrial fibrillation) (HCC)    a. diagnosed during admission in 12/2020 and spontaneously converted back to NSR    Past Surgical History:  Procedure Laterality Date   ABDOMINAL HYSTERECTOMY     HEMI-MICRODISCECTOMY LUMBAR LAMINECTOMY LEVEL 1 Left 04/04/2013   Procedure: HEMI-  LAMINECTOMY MICRODISCECTOMY OF L5 -S1 ON LEFT  ;  Surgeon: Jacki Cones, MD;  Location: [facility];  Service: Orthopedics;  Laterality: Left;   JOINT REPLACEMENT Right 2012   knee   JOINT REPLACEMENT Right 2010   hip   TOTAL HIP ARTHROPLASTY Left 09/24/2013   Procedure: LEFT TOTAL HIP ARTHROPLASTY ANTERIOR APPROACH;  Surgeon: Shelda Pal, MD;  Location: [facility];  Service: Orthopedics;  Laterality: Left;       Inpatient Medications: Scheduled Meds:  [START ON 03/31/2023] Chlorhexidine Gluconate Cloth  6 each Topical Q0600   digoxin  0.5 mg Intravenous Daily   Continuous Infusions:  diltiazem (CARDIZEM) infusion 15 mg/hr (03/30/23 1406)   PRN Meds: sodium chloride  Allergies:   No Known Allergies  Social History:   Social History   Socioeconomic History   Marital status: Married    Spouse name: Not on file   Number of children: Not on file   Years of education:  Not on file   Highest education level: Not on file  Occupational History   Not on file  Tobacco Use   Smoking status: Never   Smokeless tobacco: Never  Vaping Use   Vaping status: Never Used  Substance and Sexual Activity   Alcohol use: No   Drug use: No   Sexual activity: Yes  Other Topics Concern   Not on file  Social History Narrative   Not on file   Social Drivers of Health   Financial Resource Strain: Not on file  Food Insecurity: No Food Insecurity (03/30/2023)   Hunger Vital Sign    Worried About Running Out of Food in the Last Year: Never true    Ran Out of Food in the Last Year: Never true  Transportation Needs: No Transportation Needs (03/30/2023)   PRAPARE - Administrator, Civil Service (Medical): No    Lack of Transportation (Non-Medical): No  Physical Activity: Not on file  Stress: Not on file  Social Connections: Moderately Isolated (03/30/2023)   Social Connection and Isolation Panel [NHANES]    Frequency of Communication with Friends and Family: Once a week    Frequency of Social Gatherings with Friends and Family: More than three times a week    Attends Religious Services: Never    Database administrator or Organizations: No    Attends Banker Meetings: Never    Marital Status: Married  Catering manager  Violence: Not At Risk (03/30/2023)   Humiliation, Afraid, Rape, and Kick questionnaire    Fear of Current or Ex-Partner: No    Emotionally Abused: No    Physically Abused: No    Sexually Abused: No    Family History:  History reviewed. No pertinent family history.   ROS:  Please see the history of present illness.  ROS  All other ROS reviewed and negative.     Physical Exam/Data:   Vitals:   03/30/23 1257 03/30/23 1330 03/30/23 1351 03/30/23 1400  BP:  117/87 (!) 132/40 118/76  Pulse: (!) 154 (!) 167  (!) 105  Resp: 14 14  14   Temp:   98 F (36.7 C)   TempSrc:   Oral   SpO2: 95% 95%  97%  Weight:      Height:   5'  5" (1.651 m)     Intake/Output Summary (Last 24 hours) at 03/30/2023 1522 Last data filed at 03/30/2023 1406 Gross per 24 hour  Intake 34.27 ml  Output --  Net 34.27 ml   Filed Weights   03/30/23 1035  Weight: 117 kg   Body mass index is 42.92 kg/m.  General:  Well nourished, well developed, in no acute distres HEENT: normal Lymph: no adenopathy Neck: no JVD Endocrine:  No thryomegaly Vascular: No carotid bruits; FA pulses 2+ bilaterally without bruits  Cardiac:  normal S1, S2; RRR; no murmur  Lungs:  clear to auscultation bilaterally, no wheezing, rhonchi or rales  Abd: soft, nontender, no hepatomegaly  Ext: no edema Musculoskeletal:  No deformities, BUE and BLE strength normal and equal Skin: warm and dry  Neuro:  CNs 2-12 intact, no focal abnormalities noted Psych:  Normal affect   EKG:  The EKG was personally reviewed and demonstrates:   Telemetry:  Telemetry was personally reviewed and demonstrates:    Relevant CV Studies:   Laboratory Data:  Chemistry Recent Labs  Lab 03/30/23 1105  NA 134*  K 3.8  CL 100  CO2 22  GLUCOSE 112*  BUN 13  CREATININE 0.76  CALCIUM 9.0  GFRNONAA >60  ANIONGAP 12    Recent Labs  Lab 03/30/23 1105  PROT 8.0  ALBUMIN 4.2  AST 24  ALT 17  ALKPHOS 82  BILITOT 0.7   Hematology Recent Labs  Lab 03/30/23 1105  WBC 13.2*  RBC 5.36*  HGB 14.2  HCT 43.9  MCV 81.9  MCH 26.5  MCHC 32.3  RDW 17.0*  PLT 354   Cardiac EnzymesNo results for input(s): "TROPONINI" in the last 168 hours. No results for input(s): "TROPIPOC" in the last 168 hours.  BNP Recent Labs  Lab 03/30/23 1105  BNP 443.0*    DDimer No results for input(s): "DDIMER" in the last 168 hours.  Radiology/Studies:  DG Chest Port 1 View Result Date: 03/30/2023 CLINICAL DATA:  Tachycardia.  Chest pain EXAM: PORTABLE CHEST 1 VIEW COMPARISON:  Chest radiograph dated 12/29/2020 FINDINGS: Low lung volumes with bronchovascular crowding. Mild bilateral  interstitial opacities and bibasilar patchy opacities. No pleural effusion or pneumothorax. Similar mildly enlarged cardiomediastinal silhouette. No acute osseous abnormality. IMPRESSION: Low lung volumes with bronchovascular crowding. Mild bilateral interstitial opacities and bibasilar patchy opacities, which may represent atelectasis, aspiration, or pneumonia. Electronically Signed   By: Agustin Cree M.D.   On: 03/30/2023 12:58    Assessment and Plan:   Atrial fibrillation with RVR Hypothyroidism (Anti-thyroid peroxidase Ab positive) HTN, controlled  -Presented with palpitations to the ER.  EKG showed atrial  fibrillation with RVR, HR 150s.  Currently on diltiazem drip 15 mg/hr, will continue.  Add IV digoxin 0.5 mg one-time dose.  If rates continue to be poorly controlled, okay to give IV digoxin 0.25 mg x 2 every 6 hours.  Will schedule for DCCV tomorrow.  She has been compliant with Eliquis in the last 4 weeks.  Continue Eliquis 5 mg twice daily.  She has had history of hypothyroidism c/w drug-induced hyperthyroidism and currently has hypothyroidism.  TSH levels today are elevated, 9.  Follows with endocrine.  She has been taking methimazole at home and not levothyroxine. -BNP mildly elevated likely in setting of A-fib with RVR.  Will administer IV Lasix 20 mg 1 dose.   Informed consent for DCCV Procedure, risks and benefits of DCCV are explained to the patient.  She comprehended the risks and agreed to proceed with the procedure.   Please consult [email or web address] for contact info under Cardiology/STEMI.   Signed, Herbert Deaner, MD 03/30/2023 3:22 PM

## 2023-03-30 NOTE — ED Triage Notes (Signed)
Pt arrived via PV c/o elevated HR at home. Pt reports calling her PCP and was advised to seek treatment in the ER. Pt reports HR has been in the 150's.

## 2023-03-30 NOTE — ED Provider Notes (Signed)
[place] EMERGENCY DEPARTMENT AT [facility] Provider Note   CSN: [id number] Arrival date & time: 03/30/23  1031     History  Chief Complaint  Patient presents with   Tachycardia    [name] is a 73 y.o. female history of A-fib on Eliquis diltiazem and metoprolol, CHF presented with A-fib.  The A-fib has been present since Sunday night and she has been in the range of 130 250 bpm.  Patient has any chest pain or shortness of breath.  Patient does state however that a month ago she was told that her thyroid levels were too high and had to cut back on the Synthroid due to the hyperthyroidism.  Patient denies any recent fevers or illnesses.  Patient has never needed to be cardioverted in the past.  Patient last took her Eliquis this morning.  Home Medications Prior to Admission medications   Medication Sig Start Date End Date Taking? Authorizing Provider  methimazole (TAPAZOLE) 5 MG tablet Take 1 tablet (5 mg total) by mouth 2 (two) times daily. 12/14/22  Yes Dani Gobble, NP  apixaban (ELIQUIS) 5 MG TABS tablet Take 1 tablet (5 mg total) by mouth 2 (two) times daily. 01/01/21   Sherryll Burger, Pratik D, DO  diltiazem (CARDIZEM CD) 240 MG 24 hr capsule Take 240 mg by mouth at bedtime. 02/28/22   [provider]  furosemide (LASIX) 20 MG tablet Take 1 tablet (20 mg total) by mouth daily. Patient taking differently: Take 20 mg by mouth daily. 01/01/21 11/28/22  Sherryll Burger, Pratik D, DO  levothyroxine (SYNTHROID) 75 MCG tablet Take 75 mcg by mouth daily. Patient not taking: Reported on 11/28/2022 04/18/22   [provider]  metFORMIN (GLUCOPHAGE) 500 MG tablet Take 500 mg by mouth 2 (two) times daily. 05/04/22   [provider]  metoprolol tartrate (LOPRESSOR) 25 MG tablet Take 1 tablet (25 mg total) by mouth 2 (two) times daily. Patient taking differently: Take 25 mg by mouth 2 (two) times daily. 01/01/21 11/28/22  Maurilio Lovely D, DO      Allergies     Patient has no known allergies.    Review of Systems   Review of Systems  Physical Exam Updated Vital Signs BP (!) 123/97   Pulse (!) 148   Temp 98 F (36.7 C) (Temporal)   Resp (!) 22   Ht 5\' 5"  (1.651 m)   Wt 117 kg   SpO2 96%   BMI 42.92 kg/m  Physical Exam Vitals reviewed.  Constitutional:      General: She is not in acute distress. HENT:     Head: Normocephalic and atraumatic.  Eyes:     Extraocular Movements: Extraocular movements intact.     Conjunctiva/sclera: Conjunctivae normal.     Pupils: Pupils are equal, round, and reactive to light.  Cardiovascular:     Rate and Rhythm: Tachycardia present. Rhythm irregular.     Pulses: Normal pulses.     Heart sounds: Normal heart sounds.     Comments: A-fib with RVR Pulmonary:     Effort: Pulmonary effort is normal. No respiratory distress.     Breath sounds: Normal breath sounds.  Abdominal:     Palpations: Abdomen is soft.     Tenderness: There is no abdominal tenderness. There is no guarding or rebound.  Musculoskeletal:        General: Normal range of motion.     Cervical back: Normal range of motion and neck supple.  Right lower leg: No edema.     Left lower leg: No edema.     Comments: 5 out of 5 bilateral grip/leg extension strength  Skin:    General: Skin is warm and dry.     Capillary Refill: Capillary refill takes less than 2 seconds.  Neurological:     General: No focal deficit present.     Mental Status: She is alert and oriented to person, place, and time.     Comments: Sensation intact in all 4 limbs  Psychiatric:        Mood and Affect: Mood normal.     ED Results / Procedures / Treatments   Labs (all labs ordered are listed, but only abnormal results are displayed) Labs Reviewed  BRAIN NATRIURETIC PEPTIDE - Abnormal; Notable for the following components:      Result Value   B Natriuretic Peptide 443.0 (*)    All other components within normal limits  CBC WITH DIFFERENTIAL/PLATELET -  Abnormal; Notable for the following components:   WBC 13.2 (*)    RBC 5.36 (*)    RDW 17.0 (*)    Neutro Abs 9.9 (*)    Monocytes Absolute 1.2 (*)    All other components within normal limits  COMPREHENSIVE METABOLIC PANEL - Abnormal; Notable for the following components:   Sodium 134 (*)    Glucose, Bld 112 (*)    All other components within normal limits  TSH - Abnormal; Notable for the following components:   TSH 9.739 (*)    All other components within normal limits  TROPONIN I (HIGH SENSITIVITY) - Abnormal; Notable for the following components:   Troponin I (High Sensitivity) 29 (*)    All other components within normal limits  T4, FREE  T3, FREE  TROPONIN I (HIGH SENSITIVITY)    EKG None  Radiology No results found.  Procedures .Critical Care  Performed by: Netta Corrigan, PA-C Authorized by: Netta Corrigan, PA-C   Critical care provider statement:    Critical care time (minutes):  40   Critical care time was exclusive of:  Separately billable procedures and treating other patients   Critical care was necessary to treat or prevent imminent or life-threatening deterioration of the following conditions: afib with rvr.   Critical care was time spent personally by me on the following activities:  Blood draw for specimens, development of treatment plan with patient or surrogate, discussions with consultants, evaluation of patient's response to treatment, examination of patient, obtaining history from patient or surrogate, review of old charts, pulse oximetry, re-evaluation of patient's condition, ordering and review of radiographic studies, ordering and review of laboratory studies and ordering and performing treatments and interventions   I assumed direction of critical care for this patient from another provider in my specialty: no     Care discussed with comment:  Cardiology     Medications Ordered in ED Medications  diltiazem (CARDIZEM) 1 mg/mL load via infusion  10 mg (10 mg Intravenous Bolus from Bag 03/30/23 1109)    And  diltiazem (CARDIZEM) 125 mg in dextrose 5% 125 mL (1 mg/mL) infusion (10 mg/hr Intravenous Rate/Dose Change 03/30/23 1227)  sodium chloride 0.9 % bolus 1,000 mL (0 mLs Intravenous Stopped 03/30/23 1225)    ED Course/ Medical Decision Making/ A&P                                 Medical Decision  Making Amount and/or Complexity of Data Reviewed Labs: ordered. Radiology: ordered.  Risk Prescription drug management.   [name] 73 y.o. presented today for Afib. Working DDx that I considered at this time includes, but not limited to, Afib, hypertension, ACS, thyrotoxicosis, pericarditis, pneumonia, medication induced, PE, electrolyte abnormalities, chronic lung disease, sepsis, heart failure, tobacco use, caffeine use.  R/o DDx: hypertension, ACS, pericarditis, pneumonia, medication induced, PE, electrolyte abnormalities, chronic lung disease, sepsis, tobacco use, caffeine use: These are considered less likely due to history of present illness, physical exam, labs/imaging findings  Review of prior external notes: 11/28/2022 office visit  Unique Tests and My Independent Interpretation:  EKG: A-fib with RVR 142 bpm CBC: Mild leukocytosis 13.2 CMP: Unremarkable Chest x-ray: No acute findings Troponin: 29 TSH: 9.739 T4: Pending T3: Pending BNP: 443 which is up from baseline  Social Determinants of Health: none  Discussion with Independent Historian:  Husband  Discussion of Management of Tests:  Mallipeddi, MD Cardiology; Jarvis Newcomer, MD Hospitalist  Risk: High: hospitalization or escalation of hospital-level care  Risk Stratification Score: None  Staffed with Kommor, MD  Plan: On exam patient was in no acute distress with stable vitals. The cardiac monitor was ordered secondary to reported AFib and to monitor the patient for dysrhythmia. Cardiac monitor showed A-fib with RVR by my independent interpretation.  Rest  patient's physical exam was unremarkable.  Patient does state that a month ago she had her thyroid medication decreased due to being too hyperthyroid and so do suspect the A-fib with RVR could be related to her thyroid.  Will obtain TSH along with other labs.  Will also give fluids to help with the tachycardia and start patient on diltiazem load and infusion.  Will consult cardiology once we have labs back to see if they feel patient needs to be cardioverted as she is technically outside the 48-hour window.  I spoke to cardiology and they state to give patient more time with the diltiazem to admit to hospitalist.  They also recommend getting it T3 in conjunction with the T4 which will be ordered.  I spoke to the hospitalist and patient was accepted for admission.  Patient stable to be admitted.  This chart was dictated using voice recognition software.  Despite best efforts to proofread,  errors can occur which can change the documentation meaning.         Final Clinical Impression(s) / ED Diagnoses Final diagnoses:  Atrial fibrillation with RVR (HCC)  Elevated TSH    Rx / DC Orders ED Discharge Orders     None         Remi Deter 03/30/23 1239    Glendora Score, MD 03/30/23 Rickey Primus

## 2023-03-30 NOTE — H&P (Signed)
History and Physical    Patient: [name] AOZ:[id number] DOB: 09-21-1950 DOA: 03/30/2023 DOS: the patient was seen and examined on 03/30/2023 PCP: Assunta Found, MD  Patient coming from: Home  Chief Complaint:  Chief Complaint  Patient presents with   Tachycardia   HPI: [name] is a 73 y.o. female with medical history of AFib, hypothyroidism, HTN, obesity, and T2DM who presented to the ED with palpitations. She noticed palpitations beginning several days prior, though has not been particularly distressed, no chest pain or dyspnea. She's continued taking medications as directed. This morning felt more run down and tired, called cardiology clinic and was referred to ED where she was confirmed to be in AFib with RVR. Given diltiazem bolus and infusion, cardiology consulted and hospitalist asked to admit.   Review of Systems: No recent fever, chills, cough, sick contacts, leg swelling, orthopnea. Has nasal congestion that is typical of her allergies. As mentioned in the history of present illness. All other systems reviewed and are negative. Past Medical History:  Diagnosis Date   Arthritis    CHF (congestive heart failure) (HCC)    Diabetes mellitus without complication (HCC)    Hypertension    Lumbar disc herniation    hx. of. surgery 2'15   PAF (paroxysmal atrial fibrillation) (HCC)    a. diagnosed during admission in 12/2020 and spontaneously converted back to NSR   Past Surgical History:  Procedure Laterality Date   ABDOMINAL HYSTERECTOMY     HEMI-MICRODISCECTOMY LUMBAR LAMINECTOMY LEVEL 1 Left 04/04/2013   Procedure: HEMI-  LAMINECTOMY MICRODISCECTOMY OF L5 -S1 ON LEFT  ;  Surgeon: Jacki Cones, MD;  Location: [facility];  Service: Orthopedics;  Laterality: Left;   JOINT REPLACEMENT Right 2012   knee   JOINT REPLACEMENT Right 2010   hip   TOTAL HIP ARTHROPLASTY Left 09/24/2013   Procedure: LEFT TOTAL HIP ARTHROPLASTY ANTERIOR APPROACH;  Surgeon: Shelda Pal, MD;   Location: [facility];  Service: Orthopedics;  Laterality: Left;   Social History:  reports that she has never smoked. She has never used smokeless tobacco. She reports that she does not drink alcohol and does not use drugs.  No Known Allergies  History reviewed. No pertinent family history.  Prior to Admission medications   Medication Sig Start Date End Date Taking? Authorizing Provider  acetaminophen (TYLENOL) 500 MG tablet Take 1,000 mg by mouth every 6 (six) hours as needed for moderate pain (pain score 4-6).   Yes [provider]  apixaban (ELIQUIS) 5 MG TABS tablet Take 1 tablet (5 mg total) by mouth 2 (two) times daily. 01/01/21  Yes Shah, Pratik D, DO  bismuth subsalicylate (PEPTO BISMOL) 262 MG/15ML suspension Take 30 mLs by mouth every 6 (six) hours as needed for indigestion or diarrhea or loose stools.   Yes [provider]  calcium carbonate (TUMS - DOSED IN MG ELEMENTAL CALCIUM) 500 MG chewable tablet Chew 1 tablet by mouth daily.   Yes [provider]  diltiazem (CARDIZEM CD) 240 MG 24 hr capsule Take 240 mg by mouth at bedtime. 02/28/22  Yes [provider]  furosemide (LASIX) 20 MG tablet Take 1 tablet (20 mg total) by mouth daily. Patient taking differently: Take 20 mg by mouth daily. 01/01/21 03/30/23 Yes Shah, Pratik D, DO  methimazole (TAPAZOLE) 5 MG tablet Take 1 tablet (5 mg total) by mouth 2 (two) times daily. Patient taking differently: Take 5 mg by mouth daily. 12/14/22  Yes Dani Gobble,  NP  metoprolol tartrate (LOPRESSOR) 25 MG tablet Take 1 tablet (25 mg total) by mouth 2 (two) times daily. Patient taking differently: Take 25 mg by mouth 2 (two) times daily. 01/01/21 03/30/23 Yes Sherryll Burger, Pratik D, DO    Physical Exam: Vitals:   03/30/23 1351 03/30/23 1400 03/30/23 1558 03/30/23 1622  BP: (!) 132/40 118/76    Pulse:  (!) 105  86  Resp:  14  (!) 21  Temp: 98 F (36.7 C)  97.6 F (36.4 C)   TempSrc: Oral  Oral   SpO2:  97%  97%   Weight:      Height: 5\' 5"  (1.651 m)     Gen: Exceedingly pleasant obese female in no distress Pulm: Clear, nonlabored, tachypneic  CV: Rapid irregular without MRG or edema GI: Soft, NT, ND, +BS  Neuro: Alert and oriented. No new focal deficits. Ext: Warm, no deformities. Skin: No rashes, lesions or ulcers on visualized skin   Data Reviewed: K 3.4, Cr 0.76, BUN 13, glucose 112. LFTs wnl. WBC 13.2k, hgb/plts wnl.  Troponin 29 > 31 BNP 443.  CXR: Bilateral interstitial opacity  ECG: Rapid AFib  Assessment and Plan: Paroxysmal atrial fibrillation with RVR:  - Cardiology consulted, will plan on TEE 2/14.  - Admit to SDU on diltiazem 15mg /hr IV and given digoxin prn. Cardiology also adding prn metoprolol. BP not low at this time. Also reordered BID metoprolol dosing.  - Continue eliquis (no recent interruptions) - Lasix x1 with interstitial opacity concerning for pulmonary interstitial edema in setting of RVR.   T2DM:    - SSI  Hypothyroidism: with +anti-TPO Ab's - Continue once daily tapazole and plan for outpatient endocrinology follow up/uptake scan.  - Not on synthroid. Recent free T4 and free T3 were wnl.   Leukocytosis: Afebrile, no nidus for infection noted on history or exam.  - Monitor off abx for now, could be reactive to RVR.   Obesity: Body mass index is 42.92 kg/m.    Advance Care Planning:   Code Status: Full Code   Consults: Cardiology, Dr. Jenene Slicker  Family Communication: Spouse at bedside  Severity of Illness: The appropriate patient status for this patient is OBSERVATION. Observation status is judged to be reasonable and necessary in order to provide the required intensity of service to ensure the patient's safety. The patient's presenting symptoms, physical exam findings, and initial radiographic and laboratory data in the context of their medical condition is felt to place them at decreased risk for further clinical deterioration. Furthermore, it is  anticipated that the patient will be medically stable for discharge from the hospital within 2 midnights of admission.   Author: Tyrone Nine, MD 03/30/2023 4:52 PM  For on call review [email or web address].

## 2023-03-31 ENCOUNTER — Observation Stay (HOSPITAL_BASED_OUTPATIENT_CLINIC_OR_DEPARTMENT_OTHER): Payer: Medicare Other | Admitting: Anesthesiology

## 2023-03-31 ENCOUNTER — Observation Stay (HOSPITAL_COMMUNITY): Payer: Medicare Other | Admitting: Anesthesiology

## 2023-03-31 ENCOUNTER — Encounter (HOSPITAL_COMMUNITY): Payer: Self-pay | Admitting: Family Medicine

## 2023-03-31 ENCOUNTER — Encounter (HOSPITAL_COMMUNITY)
Admission: EM | Disposition: A | Discharge status: Home / Self Care | Payer: Self-pay | Source: Home / Self Care | Attending: Student

## 2023-03-31 DIAGNOSIS — I110 Hypertensive heart disease with heart failure: Secondary | ICD-10-CM

## 2023-03-31 DIAGNOSIS — E039 Hypothyroidism, unspecified: Secondary | ICD-10-CM

## 2023-03-31 DIAGNOSIS — R7989 Other specified abnormal findings of blood chemistry: Secondary | ICD-10-CM

## 2023-03-31 DIAGNOSIS — I4891 Unspecified atrial fibrillation: Secondary | ICD-10-CM | POA: Diagnosis not present

## 2023-03-31 DIAGNOSIS — I509 Heart failure, unspecified: Secondary | ICD-10-CM

## 2023-03-31 DIAGNOSIS — I5032 Chronic diastolic (congestive) heart failure: Secondary | ICD-10-CM | POA: Diagnosis not present

## 2023-03-31 DIAGNOSIS — I480 Paroxysmal atrial fibrillation: Secondary | ICD-10-CM | POA: Diagnosis not present

## 2023-03-31 HISTORY — PX: CARDIOVERSION: SHX1299

## 2023-03-31 LAB — BASIC METABOLIC PANEL
Anion gap: 9 (ref 5–15)
BUN: 9 mg/dL (ref 8–23)
CO2: 22 mmol/L (ref 22–32)
Calcium: 8.5 mg/dL — ABNORMAL LOW (ref 8.9–10.3)
Chloride: 105 mmol/L (ref 98–111)
Creatinine, Ser: 0.58 mg/dL (ref 0.44–1.00)
GFR, Estimated: >60 mL/min (ref >60)
Glucose, Bld: 138 mg/dL — ABNORMAL HIGH (ref 70–99)
Potassium: 3.7 mmol/L (ref 3.5–5.1)
Sodium: 136 mmol/L (ref 135–145)

## 2023-03-31 LAB — CBC
HCT: 40.7 % (ref 36.0–46.0)
Hemoglobin: 13.1 g/dL (ref 12.0–15.0)
MCH: 26.9 pg (ref 26.0–34.0)
MCHC: 32.2 g/dL (ref 30.0–36.0)
MCV: 83.6 fL (ref 80.0–100.0)
Platelets: 251 10*3/uL (ref 150–400)
RBC: 4.87 MIL/uL (ref 3.87–5.11)
RDW: 17.1 % — ABNORMAL HIGH (ref 11.5–15.5)
WBC: 9.8 10*3/uL (ref 4.0–10.5)
nRBC: 0 % (ref 0.0–0.2)

## 2023-03-31 LAB — T3, FREE: T3, Free: 3.5 pg/mL (ref 2.0–4.4)

## 2023-03-31 SURGERY — CARDIOVERSION
Anesthesia: General

## 2023-03-31 MED ORDER — FUROSEMIDE 10 MG/ML IJ SOLN
20.0000 mg | Freq: Once | INTRAMUSCULAR | Status: AC
  Administered 2023-03-31: 20 mg via INTRAVENOUS
  Filled 2023-03-31: qty 2

## 2023-03-31 MED ORDER — LIDOCAINE HCL (PF) 2 % IJ SOLN
INTRAMUSCULAR | Status: AC
  Filled 2023-03-31: qty 5

## 2023-03-31 MED ORDER — LACTATED RINGERS IV SOLN: INTRAVENOUS | Status: DC | PRN

## 2023-03-31 MED ORDER — SODIUM CHLORIDE 0.9% FLUSH: 3.0000 mL | Freq: Two times a day (BID) | INTRAVENOUS | Status: DC

## 2023-03-31 MED ORDER — METOPROLOL SUCCINATE ER 25 MG PO TB24: 12.5000 mg | ORAL_TABLET | Freq: Every day | ORAL | 1 refills | Status: DC

## 2023-03-31 MED ORDER — METHIMAZOLE 2.5 MG HALF TABLET
2.5000 mg | ORAL_TABLET | Freq: Every day | ORAL | Status: DC
Start: 2023-03-31 — End: 2023-03-31
  Administered 2023-03-31: 2.5 mg via ORAL
  Filled 2023-03-31 (×4): qty 1

## 2023-03-31 MED ORDER — PANTOPRAZOLE SODIUM 40 MG PO TBEC: 40.0000 mg | DELAYED_RELEASE_TABLET | Freq: Every day | ORAL | 1 refills | Status: DC

## 2023-03-31 MED ORDER — PROPOFOL 500 MG/50ML IV EMUL
INTRAVENOUS | Status: AC
  Filled 2023-03-31: qty 50

## 2023-03-31 MED ORDER — ATROPINE SULFATE 0.4 MG/ML IV SOLN
INTRAVENOUS | Status: AC
  Filled 2023-03-31: qty 1

## 2023-03-31 MED ORDER — METHIMAZOLE 5 MG PO TABS: 2.5000 mg | ORAL_TABLET | Freq: Every day | ORAL | Status: DC

## 2023-03-31 MED ORDER — SODIUM CHLORIDE 0.9% FLUSH
3.0000 mL | INTRAVENOUS | Status: DC | PRN
Start: 2023-03-31 — End: 2023-03-31

## 2023-03-31 NOTE — Anesthesia Preprocedure Evaluation (Addendum)
Anesthesia Evaluation  Patient identified by MRN, date of birth, ID band Patient awake    Reviewed: Allergy & Precautions, H&P , NPO status , Patient's Chart, lab work & pertinent test results  Airway Mallampati: III  TM Distance: >3 FB Neck ROM: Full    Dental no notable dental hx.    Pulmonary neg pulmonary ROS   Pulmonary exam normal breath sounds clear to auscultation       Cardiovascular hypertension, +CHF  + dysrhythmias Atrial Fibrillation  Rhythm:Irregular Rate:Abnormal     Neuro/Psych negative neurological ROS  negative psych ROS   GI/Hepatic negative GI ROS, Neg liver ROS,,,  Endo/Other  negative endocrine ROSdiabetesHypothyroidism  Class 3 obesity  Renal/GU negative Renal ROS  negative genitourinary   Musculoskeletal negative musculoskeletal ROS (+) Arthritis ,    Abdominal  (+) + obese  Peds negative pediatric ROS (+)  Hematology negative hematology ROS (+) Blood dyscrasia, anemia   Anesthesia Other Findings   Reproductive/Obstetrics negative OB ROS                             Anesthesia Physical Anesthesia Plan  ASA: 3  Anesthesia Plan: General   Post-op Pain Management:    Induction:   PONV Risk Score and Plan: 1  Airway Management Planned: Nasal Cannula and Simple Face Mask  Additional Equipment:   Intra-op Plan:   Post-operative Plan:   Informed Consent: I have reviewed the patients History and Physical, chart, labs and discussed the procedure including the risks, benefits and alternatives for the proposed anesthesia with the patient or authorized representative who has indicated his/her understanding and acceptance.       Plan Discussed with: CRNA  Anesthesia Plan Comments:        Anesthesia Quick Evaluation

## 2023-03-31 NOTE — Transfer of Care (Signed)
Immediate Anesthesia Transfer of Care Note  Patient: [name]  Procedure(s) Performed: CARDIOVERSION  Patient Location: PACU  Anesthesia Type:General  Level of Consciousness: awake, alert , and oriented  Airway & Oxygen Therapy: Patient Spontanous Breathing and Patient connected to nasal cannula oxygen  Post-op Assessment: Report given to RN and Post -op Vital signs reviewed and stable  Post vital signs: Reviewed and stable  Last Vitals:  Vitals Value Taken Time  BP    Temp    Pulse    Resp    SpO2      Last Pain:  Vitals:   03/31/23 1232  TempSrc: Oral  PainSc: 0-No pain      Patients Stated Pain Goal: 0 (03/30/23 2000)  Complications: No notable events documented.

## 2023-03-31 NOTE — Plan of Care (Signed)
Problem: Education: Goal: Knowledge of General Education information will improve Description: Including pain rating scale, medication(s)/side effects and non-pharmacologic comfort measures Outcome: Progressing   Problem: Coping: Goal: Level of anxiety will decrease Outcome: Progressing   Problem: Elimination: Goal: Will not experience complications related to urinary retention Outcome: Progressing   Problem: Pain Managment: Goal: General experience of comfort will improve and/or be controlled Outcome: Progressing   Problem: Safety: Goal: Ability to remain free from injury will improve Outcome: Progressing   Problem: Skin Integrity: Goal: Risk for impaired skin integrity will decrease Outcome: Progressing

## 2023-03-31 NOTE — Progress Notes (Signed)
Rounding Note    Patient Name: [name] Date of Encounter: 03/31/2023  [facility] Cardiologist: Nona Dell, MD   Subjective   Reports her palpitations have improved since admission but still noticeable. Did not sleep well last night. Aware of plans for DCCV today and reviewed with the patient's spouse at the bedside as well. She has been NPO since midnight.   Inpatient Medications    Scheduled Meds:  apixaban  5 mg Oral BID   Chlorhexidine Gluconate Cloth  6 each Topical Q0600   furosemide  20 mg Intravenous Once   methimazole  2.5 mg Oral Daily   metoprolol tartrate  25 mg Oral BID   oxymetazoline  1 spray Each Nare BID   sodium chloride flush  3 mL Intravenous Q12H   Continuous Infusions:  diltiazem (CARDIZEM) infusion 15 mg/hr (03/30/23 2017)   PRN Meds: acetaminophen **OR** acetaminophen, metoprolol tartrate, ondansetron **OR** ondansetron (ZOFRAN) IV, sodium chloride   Vital Signs    Vitals:   03/31/23 0515 03/31/23 0530 03/31/23 0739 03/31/23 0743  BP: 107/71 134/80    Pulse: 72 65 (!) 30   Resp: 17 19 17    Temp:    98.2 F (36.8 C)  TempSrc:    Oral  SpO2: 95% 94% 98%   Weight:      Height:        Intake/Output Summary (Last 24 hours) at 03/31/2023 0845 Last data filed at 03/30/2023 2148 Gross per 24 hour  Intake 44.27 ml  Output --  Net 44.27 ml      03/31/2023    5:00 AM 03/30/2023    1:51 PM 03/30/2023   10:35 AM  Last 3 Weights  Weight (lbs) 246 lb 11.1 oz 227 lb 1.2 oz 257 lb 15 oz  Weight (kg) 111.9 kg 103 kg 117 kg      Telemetry    Atrial fibrillation, HR mostly in 110's to 130's.  - Personally Reviewed  ECG    Atrial fibrillation with RVR, HR 142 with LAD and LVH.- Personally Reviewed  Physical Exam   GEN: Pleasant female appearing in no acute distress.   Neck: No JVD Cardiac: Irregularly irregular, no murmurs, rubs, or gallops.  Respiratory: No wheezing or rhonchi. Breath sounds reduced along bases.  GI:  Soft, nontender, non-distended  MS: Trace lower extremity edema; No deformity. Neuro:  Nonfocal  Psych: Normal affect   Labs    High Sensitivity Troponin:   Recent Labs  Lab 03/30/23 1105 03/30/23 1309  TROPONINIHS 29* 31*     Chemistry Recent Labs  Lab 03/30/23 1105 03/31/23 0507  NA 134* 136  K 3.8 3.7  CL 100 105  CO2 22 22  GLUCOSE 112* 138*  BUN 13 9  CREATININE 0.76 0.58  CALCIUM 9.0 8.5*  PROT 8.0  --   ALBUMIN 4.2  --   AST 24  --   ALT 17  --   ALKPHOS 82  --   BILITOT 0.7  --   GFRNONAA >60 >60  ANIONGAP 12 9    Lipids No results for input(s): "CHOL", "TRIG", "HDL", "LABVLDL", "LDLCALC", "CHOLHDL" in the last 168 hours.  Hematology Recent Labs  Lab 03/30/23 1105 03/31/23 0507  WBC 13.2* 9.8  RBC 5.36* 4.87  HGB 14.2 13.1  HCT 43.9 40.7  MCV 81.9 83.6  MCH 26.5 26.9  MCHC 32.3 32.2  RDW 17.0* 17.1*  PLT 354 251   Thyroid  Recent Labs  Lab 03/30/23 1105  03/30/23 1309  TSH 9.739*  --   FREET4  --  0.85    BNP Recent Labs  Lab 03/30/23 1105  BNP 443.0*    DDimer No results for input(s): "DDIMER" in the last 168 hours.   Radiology    DG Chest Port 1 View Result Date: 03/30/2023 CLINICAL DATA:  Tachycardia.  Chest pain EXAM: PORTABLE CHEST 1 VIEW COMPARISON:  Chest radiograph dated 12/29/2020 FINDINGS: Low lung volumes with bronchovascular crowding. Mild bilateral interstitial opacities and bibasilar patchy opacities. No pleural effusion or pneumothorax. Similar mildly enlarged cardiomediastinal silhouette. No acute osseous abnormality. IMPRESSION: Low lung volumes with bronchovascular crowding. Mild bilateral interstitial opacities and bibasilar patchy opacities, which may represent atelectasis, aspiration, or pneumonia. Electronically Signed   By: Agustin Cree M.D.   On: 03/30/2023 12:58    Cardiac Studies    Echocardiogram: 12/2020  1. Images are limited.   2. Left ventricular ejection fraction, by estimation, is 60 to 65%. The  left  ventricle has normal function. The left ventricle has no regional  wall motion abnormalities. There is mild left ventricular hypertrophy.  Left ventricular diastolic parameters  are indeterminate.   3. Right ventricular systolic function is normal. The right ventricular  size is normal. Tricuspid regurgitation signal is inadequate for assessing  PA pressure.   4. Left atrial size was mildly dilated.   5. The mitral valve was not well visualized. Trivial mitral valve  regurgitation. No evidence of mitral stenosis. The mean mitral valve  gradient is 2.0 mmHg.   6. The aortic valve was not well visualized. Aortic valve regurgitation  is not visualized. Aortic valve mean gradient measures 9.0 mmHg.   Comparison(s): No prior Echocardiogram.   Patient Profile     73 y.o. female w/ PMH of paroxysmal atrial fibrillation and Hypothyroidism who is currently admitted for atrial fibrillation with RVR  Assessment & Plan    1. Atrial Fibrillation with RVR - She has a history of atrial fibrillation and previously occurring in 2022 but developed recurrence 5 days prior to admission. She has been on IV Cardizem along with Lopressor 25 mg twice daily and also received IV Digoxin and intermittent IV Lopressor. Rates remain poorly controlled, peaking into the 140's at times with minimal activity. As discussed in the initial consult note, will plan for DCCV today. Risks and benefits previously reviewed and the patient is in agreement to proceed. Tentatively scheduled for 1330 today and a message has been sent to anesthesia to see if there is sooner availability.  - Continue Eliquis 5mg  BID for anticoagulation.   2. Lower Extremity Edema - She does report some lower extremity edema and BNP was elevated at 443 on admission. Likely due to atrial fibrillation with RVR.  t was previously recommended to give IV Lasix 20 mg x 1 dose but this has not yet been ordered. Will order for today. Will order a low dose given  that she is currently NPO.  3. Hypothyroidism - Previously on Synthroid but developed hyperthyroidism and has been on Methimazole. TSH at 9.739 this admission with normal Free T3 and T4. She has been continued on Methimazole 2.5 mg daily. Needs follow-up with Endocrinology as an outpatient.   For questions or updates, please contact [facility] Please consult [email or web address] for contact info under        Signed, Ellsworth Lennox, PA-C  03/31/2023, 8:45 AM

## 2023-03-31 NOTE — Discharge Summary (Signed)
Physician Discharge Summary   Patient: [name] MRN: [id number] DOB: 1950-05-07  Admit date:     03/30/2023  Discharge date: 03/31/23  Discharge Physician: Vassie Loll   PCP: Assunta Found, MD   Recommendations at discharge:  Repeat basic metabolic panel to follow electrolytes and renal function Reassess thyroid panel in 4 weeks and further adjust thyroid medication regimen as required. Reassess blood pressure and adjust antihypertensive regimen Continue assisting patient with weight loss management Make sure patient follow-up with cardiology service as instructed.  Discharge Diagnoses: Principal Problem:   Atrial fibrillation with rapid ventricular response (HCC) Active Problems:   Morbid obesity (HCC)   Atrial fibrillation with RVR (HCC) Chronic diastolic heart failure  Brief Hospital admission course: As per H&P written by Dr. Jarvis Newcomer on 03/30/2023 [name] is a 73 y.o. female with medical history of AFib, hypothyroidism, HTN, obesity, and T2DM who presented to the ED with palpitations. She noticed palpitations beginning several days prior, though has not been particularly distressed, no chest pain or dyspnea. She's continued taking medications as directed. This morning felt more run down and tired, called cardiology clinic and was referred to ED where she was confirmed to be in AFib with RVR. Given diltiazem bolus and infusion, cardiology consulted and hospitalist asked to admit.    Review of Systems: No recent fever, chills, cough, sick contacts, leg swelling, orthopnea. Has nasal congestion that is typical of her allergies. As mentioned in the history of present illness. All other systems reviewed and are negative.  Assessment and Plan: 1-paroxysmal atrial fibrillation with RVR -Appreciate assistance and recommendations by cardiology service -Patient is status post cardioversion with normal sinus rhythm and achieved at time of discharge -Patient will continue daily  Cardizem (to 40 mg daily) and adjusted dose of extended release metoprolol. -Continue treatment with Eliquis -Continue outpatient follow-up with cardiology service.  2-elevated TSH -Appears to be iatrogenic hypothyroidism -Patient's methimazole dose has been adjusted -Follow-up thyroid panel in the next 4 weeks; further adjustment to be decided as required.  3-essential hypertension -Blood pressure stable and well-controlled -Continue current antihypertensive agents.  4-chronic diastolic heart failure -Continue treatment with as needed Lasix -Low-sodium diet, daily weights and adequate hydration discussed with patient.  5-morbid obesity -Body mass index is 41.05 kg/m. -Low-calorie diet, portion control and increase physical activity discussed with patient.  6-type 2 diabetes -Sliding scale insulin use while inpatient; continue modified low carbohydrates at discharge. -Follow CBG fluctuation and determine the need of initiating hypoglycemic agents.  7-GERD -Continue PPI and as needed Tums.  8-leukocytosis -No acute infection appreciated -Appears to be demargination and stress -Maintain adequate hydration and follow complete blood count trend.   Consultants: Cardiology service. Procedures performed: See below for x-ray report; cardioversion. Disposition: Home  Diet recommendation: Heart healthy/low-sodium, modified carbohydrates and low calorie diet.  DISCHARGE MEDICATION: Allergies as of 03/31/2023   No Known Allergies      Medication List     STOP taking these medications    bismuth subsalicylate 262 MG/15ML suspension Commonly known as: PEPTO BISMOL   metoprolol tartrate 25 MG tablet Commonly known as: LOPRESSOR       TAKE these medications    acetaminophen 500 MG tablet Commonly known as: TYLENOL Take 1,000 mg by mouth every 6 (six) hours as needed for moderate pain (pain score 4-6).   apixaban 5 MG Tabs tablet Commonly known as: ELIQUIS Take 1  tablet (5 mg total) by mouth 2 (two) times daily.   calcium carbonate  500 MG chewable tablet Commonly known as: TUMS - dosed in mg elemental calcium Chew 1 tablet by mouth daily.   diltiazem 240 MG 24 hr capsule Commonly known as: CARDIZEM CD Take 240 mg by mouth at bedtime.   furosemide 20 MG tablet Commonly known as: Lasix Take 1 tablet (20 mg total) by mouth daily.   methimazole 5 MG tablet Commonly known as: TAPAZOLE Take 0.5 tablets (2.5 mg total) by mouth daily. What changed:  how much to take when to take this   metoprolol succinate 25 MG 24 hr tablet Commonly known as: Toprol XL Take 0.5 tablets (12.5 mg total) by mouth daily.   pantoprazole 40 MG tablet Commonly known as: Protonix Take 1 tablet (40 mg total) by mouth daily.        Follow-up Information     Assunta Found, MD. Schedule an appointment as soon as possible for a visit in 10 day(s).   Specialty: Family Medicine Contact information: [address] [place] [address] [phone]                Discharge Exam: Ceasar Mons Weights   03/30/23 1351 03/31/23 0500 03/31/23 1232  Weight: 103 kg 111.9 kg 111.9 kg   General exam: Alert, awake, oriented x 3; morbidly obese.  No chest pain, no nausea, no vomiting.  Denies chest pain or palpitations. Respiratory system: Clear to auscultation. Respiratory effort normal.  Good saturation on room air. Cardiovascular system:RRR. No rubs or gallops; unable to assess JVD with body habitus. Gastrointestinal system: Abdomen is obese, patient's nondistended, soft and nontender. No organomegaly or masses felt. Normal bowel sounds heard. Central nervous system: No focal neurological deficits. Extremities: No cyanosis or clubbing; trace edema bilaterally. Skin: No petechiae. Psychiatry: Judgement and insight appear normal. Mood & affect appropriate.   Condition at discharge: stable and improved.  The results of significant diagnostics from this  hospitalization (including imaging, microbiology, ancillary and laboratory) are listed below for reference.   Imaging Studies: DG Chest Port 1 View Result Date: 03/30/2023 CLINICAL DATA:  Tachycardia.  Chest pain EXAM: PORTABLE CHEST 1 VIEW COMPARISON:  Chest radiograph dated 12/29/2020 FINDINGS: Low lung volumes with bronchovascular crowding. Mild bilateral interstitial opacities and bibasilar patchy opacities. No pleural effusion or pneumothorax. Similar mildly enlarged cardiomediastinal silhouette. No acute osseous abnormality. IMPRESSION: Low lung volumes with bronchovascular crowding. Mild bilateral interstitial opacities and bibasilar patchy opacities, which may represent atelectasis, aspiration, or pneumonia. Electronically Signed   By: Agustin Cree M.D.   On: 03/30/2023 12:58    Microbiology: Results for orders placed or performed during the hospital encounter of 03/30/23  MRSA Next Gen by PCR, Nasal     Status: None   Collection Time: 03/30/23  1:45 PM   Specimen: Nasal Mucosa; Nasal Swab  Result Value Ref Range Status   MRSA by PCR Next Gen NOT DETECTED NOT DETECTED Final    Comment: (NOTE) The GeneXpert MRSA Assay (FDA approved for NASAL specimens only), is one component of a comprehensive MRSA colonization surveillance program. It is not intended to diagnose MRSA infection nor to guide or monitor treatment for MRSA infections. Test performance is not FDA approved in patients less than 70 years old. Performed at [facility], [address], [place], [place] [address]     Labs: CBC: Recent Labs  Lab 03/30/23 1105 03/31/23 0507  WBC 13.2* 9.8  NEUTROABS 9.9*  --   HGB 14.2 13.1  HCT 43.9 40.7  MCV 81.9 83.6  PLT 354 251  Basic Metabolic Panel: Recent Labs  Lab 03/30/23 1105 03/31/23 0507  NA 134* 136  K 3.8 3.7  CL 100 105  CO2 22 22  GLUCOSE 112* 138*  BUN 13 9  CREATININE 0.76 0.58  CALCIUM 9.0 8.5*   Liver Function Tests: Recent Labs  Lab 03/30/23 1105   AST 24  ALT 17  ALKPHOS 82  BILITOT 0.7  PROT 8.0  ALBUMIN 4.2    Discharge time spent: greater than 30 minutes.  Signed: Vassie Loll, MD [facility] 03/31/2023

## 2023-03-31 NOTE — Interval H&P Note (Signed)
History and Physical Interval Note:  03/31/2023 1:57 PM  [name]  has presented today for surgery, with the diagnosis of a-fib with rvr.  The various methods of treatment have been discussed with the patient and family. After consideration of risks, benefits and other options for treatment, the patient has consented to  Procedure(s): CARDIOVERSION (N/A) as a surgical intervention.  The patient's history has been reviewed, patient examined, no change in status, stable for surgery.  I have reviewed the patient's chart and labs.  Questions were answered to the patient's satisfaction.     Chukwuemeka Artola Norton Pastel, MD [facility]

## 2023-03-31 NOTE — TOC CM/SW Note (Signed)
Transition of Care [facility]) - Inpatient Brief Assessment   Patient Details  Name: [name] MRN: [id number] Date of Birth: 29-Aug-1950  Transition of Care [facility]) CM/SW Contact:    Villa Herb, LCSWA Phone Number: 03/31/2023, 10:38 AM   Clinical Narrative: Transition of Care Department [facility]) has reviewed patient and no TOC needs have been identified at this time. We will continue to monitor patient advancement through interdiciplinary progression rounds. If new patient transition needs arise, please place a TOC consult.   Transition of Care Asessment: Insurance and Status: Insurance coverage has been reviewed Patient has primary care physician: Yes Home environment has been reviewed: From home Prior level of function:: Independent Prior/Current Home Services: No current home services Social Drivers of Health Review: SDOH reviewed no interventions necessary Readmission risk has been reviewed: Yes Transition of care needs: no transition of care needs at this time

## 2023-03-31 NOTE — H&P (View-Only) (Signed)
Rounding Note    Patient Name: [name] Date of Encounter: 03/31/2023  [facility] Cardiologist: Nona Dell, MD   Subjective   Reports her palpitations have improved since admission but still noticeable. Did not sleep well last night. Aware of plans for DCCV today and reviewed with the patient's spouse at the bedside as well. She has been NPO since midnight.   Inpatient Medications    Scheduled Meds:  apixaban  5 mg Oral BID   Chlorhexidine Gluconate Cloth  6 each Topical Q0600   furosemide  20 mg Intravenous Once   methimazole  2.5 mg Oral Daily   metoprolol tartrate  25 mg Oral BID   oxymetazoline  1 spray Each Nare BID   sodium chloride flush  3 mL Intravenous Q12H   Continuous Infusions:  diltiazem (CARDIZEM) infusion 15 mg/hr (03/30/23 2017)   PRN Meds: acetaminophen **OR** acetaminophen, metoprolol tartrate, ondansetron **OR** ondansetron (ZOFRAN) IV, sodium chloride   Vital Signs    Vitals:   03/31/23 0515 03/31/23 0530 03/31/23 0739 03/31/23 0743  BP: 107/71 134/80    Pulse: 72 65 (!) 30   Resp: 17 19 17    Temp:    98.2 F (36.8 C)  TempSrc:    Oral  SpO2: 95% 94% 98%   Weight:      Height:        Intake/Output Summary (Last 24 hours) at 03/31/2023 0845 Last data filed at 03/30/2023 2148 Gross per 24 hour  Intake 44.27 ml  Output --  Net 44.27 ml      03/31/2023    5:00 AM 03/30/2023    1:51 PM 03/30/2023   10:35 AM  Last 3 Weights  Weight (lbs) 246 lb 11.1 oz 227 lb 1.2 oz 257 lb 15 oz  Weight (kg) 111.9 kg 103 kg 117 kg      Telemetry    Atrial fibrillation, HR mostly in 110's to 130's.  - Personally Reviewed  ECG    Atrial fibrillation with RVR, HR 142 with LAD and LVH.- Personally Reviewed  Physical Exam   GEN: Pleasant female appearing in no acute distress.   Neck: No JVD Cardiac: Irregularly irregular, no murmurs, rubs, or gallops.  Respiratory: No wheezing or rhonchi. Breath sounds reduced along bases.  GI:  Soft, nontender, non-distended  MS: Trace lower extremity edema; No deformity. Neuro:  Nonfocal  Psych: Normal affect   Labs    High Sensitivity Troponin:   Recent Labs  Lab 03/30/23 1105 03/30/23 1309  TROPONINIHS 29* 31*     Chemistry Recent Labs  Lab 03/30/23 1105 03/31/23 0507  NA 134* 136  K 3.8 3.7  CL 100 105  CO2 22 22  GLUCOSE 112* 138*  BUN 13 9  CREATININE 0.76 0.58  CALCIUM 9.0 8.5*  PROT 8.0  --   ALBUMIN 4.2  --   AST 24  --   ALT 17  --   ALKPHOS 82  --   BILITOT 0.7  --   GFRNONAA >60 >60  ANIONGAP 12 9    Lipids No results for input(s): "CHOL", "TRIG", "HDL", "LABVLDL", "LDLCALC", "CHOLHDL" in the last 168 hours.  Hematology Recent Labs  Lab 03/30/23 1105 03/31/23 0507  WBC 13.2* 9.8  RBC 5.36* 4.87  HGB 14.2 13.1  HCT 43.9 40.7  MCV 81.9 83.6  MCH 26.5 26.9  MCHC 32.3 32.2  RDW 17.0* 17.1*  PLT 354 251   Thyroid  Recent Labs  Lab 03/30/23 1105  03/30/23 1309  TSH 9.739*  --   FREET4  --  0.85    BNP Recent Labs  Lab 03/30/23 1105  BNP 443.0*    DDimer No results for input(s): "DDIMER" in the last 168 hours.   Radiology    DG Chest Port 1 View Result Date: 03/30/2023 CLINICAL DATA:  Tachycardia.  Chest pain EXAM: PORTABLE CHEST 1 VIEW COMPARISON:  Chest radiograph dated 12/29/2020 FINDINGS: Low lung volumes with bronchovascular crowding. Mild bilateral interstitial opacities and bibasilar patchy opacities. No pleural effusion or pneumothorax. Similar mildly enlarged cardiomediastinal silhouette. No acute osseous abnormality. IMPRESSION: Low lung volumes with bronchovascular crowding. Mild bilateral interstitial opacities and bibasilar patchy opacities, which may represent atelectasis, aspiration, or pneumonia. Electronically Signed   By: Agustin Cree M.D.   On: 03/30/2023 12:58    Cardiac Studies    Echocardiogram: 12/2020  1. Images are limited.   2. Left ventricular ejection fraction, by estimation, is 60 to 65%. The  left  ventricle has normal function. The left ventricle has no regional  wall motion abnormalities. There is mild left ventricular hypertrophy.  Left ventricular diastolic parameters  are indeterminate.   3. Right ventricular systolic function is normal. The right ventricular  size is normal. Tricuspid regurgitation signal is inadequate for assessing  PA pressure.   4. Left atrial size was mildly dilated.   5. The mitral valve was not well visualized. Trivial mitral valve  regurgitation. No evidence of mitral stenosis. The mean mitral valve  gradient is 2.0 mmHg.   6. The aortic valve was not well visualized. Aortic valve regurgitation  is not visualized. Aortic valve mean gradient measures 9.0 mmHg.   Comparison(s): No prior Echocardiogram.   Patient Profile     73 y.o. female w/ PMH of paroxysmal atrial fibrillation and Hypothyroidism who is currently admitted for atrial fibrillation with RVR  Assessment & Plan    1. Atrial Fibrillation with RVR - She has a history of atrial fibrillation and previously occurring in 2022 but developed recurrence 5 days prior to admission. She has been on IV Cardizem along with Lopressor 25 mg twice daily and also received IV Digoxin and intermittent IV Lopressor. Rates remain poorly controlled, peaking into the 140's at times with minimal activity. As discussed in the initial consult note, will plan for DCCV today. Risks and benefits previously reviewed and the patient is in agreement to proceed. Tentatively scheduled for 1330 today and a message has been sent to anesthesia to see if there is sooner availability.  - Continue Eliquis 5mg  BID for anticoagulation.   2. Lower Extremity Edema - She does report some lower extremity edema and BNP was elevated at 443 on admission. Likely due to atrial fibrillation with RVR.  t was previously recommended to give IV Lasix 20 mg x 1 dose but this has not yet been ordered. Will order for today. Will order a low dose given  that she is currently NPO.  3. Hypothyroidism - Previously on Synthroid but developed hyperthyroidism and has been on Methimazole. TSH at 9.739 this admission with normal Free T3 and T4. She has been continued on Methimazole 2.5 mg daily. Needs follow-up with Endocrinology as an outpatient.   For questions or updates, please contact [facility] Please consult [email or web address] for contact info under        Signed, Ellsworth Lennox, PA-C  03/31/2023, 8:45 AM

## 2023-03-31 NOTE — Anesthesia Procedure Notes (Signed)
Date/Time: 03/31/2023 1:49 PM  Performed by: Julian Reil, CRNAPre-anesthesia Checklist: Patient identified, Emergency Drugs available, Suction available and Patient being monitored Patient Re-evaluated:Patient Re-evaluated prior to induction Oxygen Delivery Method: Non-rebreather mask Induction Type: IV induction Placement Confirmation: positive ETCO2

## 2023-03-31 NOTE — CV Procedure (Signed)
   DIRECT CURRENT CARDIOVERSION  NAME:  [name]    MRN: [id number] DOB:  1950/11/29    ADMIT DATE: 03/30/2023  INDICATIONS: Symptomatic atrial fibrillation with RVR  PROCEDURE:  Informed consent was obtained prior to the procedure. The patient had the defibrillator pads placed in the anterior and posterior position. Once an appropriate level of sedation was confirmed, the patient was cardioverted x 1 with 200J of biphasic synchronized energy.  The patient converted to NSR.  There were no apparent complications.  The patient had normal neuro status and respiratory status post procedure with vitals stable as recorded elsewhere.  Adequate airway was maintained throughout and vital signs monitored per protocol.  RECOMMENDATIONS: Post DCCV EKG showed NSR. Discontinue diltiazem drip and start home medication, diltiazem 240 mg once daily. Continue uninterrupted Eliquis 5 mg BID. Okay to go home today.  Jolee Critcher Verne Spurr, MD [place]  [facility]  1:58 PM

## 2023-03-31 NOTE — Plan of Care (Signed)

## 2023-03-31 NOTE — Anesthesia Postprocedure Evaluation (Signed)
Anesthesia Post Note  Patient: [name]  Procedure(s) Performed: CARDIOVERSION  Patient location during evaluation: PACU Anesthesia Type: General Level of consciousness: awake and alert Pain management: pain level controlled Vital Signs Assessment: post-procedure vital signs reviewed and stable Respiratory status: spontaneous breathing, nonlabored ventilation, respiratory function stable and patient connected to nasal cannula oxygen Cardiovascular status: blood pressure returned to baseline and stable Postop Assessment: no apparent nausea or vomiting Anesthetic complications: no   No notable events documented.   Last Vitals:  Vitals:   03/31/23 1410 03/31/23 1415  BP: 129/61 118/62  Pulse:  (!) 58  Resp:  15  Temp: 37 C   SpO2: 100%     Last Pain:  Vitals:   03/31/23 1400  TempSrc:   PainSc: 0-No pain                 Roslynn Amble

## 2023-04-01 ENCOUNTER — Encounter (HOSPITAL_COMMUNITY): Payer: Self-pay | Admitting: Internal Medicine

## 2023-04-03 ENCOUNTER — Encounter (HOSPITAL_COMMUNITY): Payer: Self-pay | Admitting: Internal Medicine

## 2023-04-10 ENCOUNTER — Telehealth: Payer: Self-pay | Admitting: Nurse Practitioner

## 2023-04-10 NOTE — Telephone Encounter (Signed)
 Looks like the ED lowered her Methimazole to 2.5 mg daily.  We can wait another 6-8 weeks , have her repeat labs (order already existing in the system) and then follow up in office to discuss findings.

## 2023-04-10 NOTE — Telephone Encounter (Signed)
 Pt called and was recently in the hospital, ED stated to follow up with you to let you know about heart problem she went to ED for.  Labs and notes in chart.  Does she need a f/u sooner than the 2 months that was previously discussed?

## 2023-04-11 NOTE — Telephone Encounter (Signed)
 Pt was informed and scheduled for 8 weeks out with labs.

## 2023-04-19 DIAGNOSIS — I4891 Unspecified atrial fibrillation: Secondary | ICD-10-CM | POA: Diagnosis not present

## 2023-04-19 DIAGNOSIS — E1159 Type 2 diabetes mellitus with other circulatory complications: Secondary | ICD-10-CM | POA: Diagnosis not present

## 2023-04-19 DIAGNOSIS — E6609 Other obesity due to excess calories: Secondary | ICD-10-CM | POA: Diagnosis not present

## 2023-04-19 DIAGNOSIS — I10 Essential (primary) hypertension: Secondary | ICD-10-CM | POA: Diagnosis not present

## 2023-04-19 DIAGNOSIS — Z6841 Body Mass Index (BMI) 40.0 and over, adult: Secondary | ICD-10-CM | POA: Diagnosis not present

## 2023-04-24 ENCOUNTER — Encounter (HOSPITAL_COMMUNITY): Payer: Self-pay | Admitting: Internal Medicine

## 2023-05-29 DIAGNOSIS — R768 Other specified abnormal immunological findings in serum: Secondary | ICD-10-CM | POA: Diagnosis not present

## 2023-05-29 DIAGNOSIS — E0590 Thyrotoxicosis, unspecified without thyrotoxic crisis or storm: Secondary | ICD-10-CM | POA: Diagnosis not present

## 2023-05-30 LAB — T4, FREE: Free T4: 1.08 ng/dL (ref 0.82–1.77)

## 2023-05-30 LAB — T3, FREE: T3, Free: 3.7 pg/mL (ref 2.0–4.4)

## 2023-05-30 LAB — TSH: TSH: 3.79 u[IU]/mL (ref 0.450–4.500)

## 2023-05-31 ENCOUNTER — Encounter: Payer: Self-pay | Admitting: Cardiology

## 2023-05-31 ENCOUNTER — Ambulatory Visit: Payer: Medicare Other | Attending: Cardiology | Admitting: Cardiology

## 2023-05-31 VITALS — BP 150/78 | Ht 65.0 in | Wt 262.0 lb

## 2023-05-31 DIAGNOSIS — I10 Essential (primary) hypertension: Secondary | ICD-10-CM | POA: Diagnosis not present

## 2023-05-31 DIAGNOSIS — I480 Paroxysmal atrial fibrillation: Secondary | ICD-10-CM | POA: Insufficient documentation

## 2023-05-31 MED ORDER — METOPROLOL SUCCINATE ER 25 MG PO TB24: 12.5000 mg | ORAL_TABLET | Freq: Every day | ORAL | 3 refills | Status: DC

## 2023-05-31 NOTE — Progress Notes (Signed)
    Cardiology Office Note  Date: 05/31/2023   ID: [name], DOB February 10, 1951, MRN [id number]  History of Present Illness: [name] is a 73 y.o. female last seen in April 2024.  She is here today with her husband for a follow-up visit.  I reviewed interval records, she was hospitalized in February with recurrent atrial fibrillation and RVR status post successful cardioversion on February 13.  She denies any significant palpitations since that time and reports compliance with her medications.  We went over her medications.  She does not report any spontaneous bleeding problems on Eliquis.  Does need a refill for Toprol-XL.  I rechecked her blood pressure at 150/78.  She states that she checks her blood pressure regularly at home reporting systolics in the 120s to 130s.  Physical Exam: VS:  BP (!) 150/78 (BP Location: Right Arm)   Ht 5\' 5"  (1.651 m)   Wt 262 lb (118.8 kg)   SpO2 97%   BMI 43.60 kg/m , BMI Body mass index is 43.6 kg/m.  Wt Readings from Last 3 Encounters:  05/31/23 262 lb (118.8 kg)  03/31/23 246 lb 11.1 oz (111.9 kg)  11/28/22 258 lb 6.4 oz (117.2 kg)    General: Patient appears comfortable at rest. HEENT: Conjunctiva and lids normal. Neck: Supple, no elevated JVP or carotid bruits. Lungs: Clear to auscultation, nonlabored breathing at rest. Cardiac: Regular rate and rhythm, no S3 or significant systolic murmur.  ECG:  An ECG dated 03/31/2023 was personally reviewed today and demonstrated:  Sinus rhythm with LVH and repolarization abnormalities.  Labwork: April 2024: Cholesterol 125, triglycerides 100, HDL 42, LDL 64 03/30/2023: ALT 17; AST 24; B Natriuretic Peptide 443.0 03/31/2023: BUN 9; Creatinine, Ser 0.58; Hemoglobin 13.1; Platelets 251; Potassium 3.7; Sodium 136 05/29/2023: TSH 3.790   Other Studies Reviewed Today:  No interval cardiac testing for review today.  Assessment and Plan:  1.  Paroxysmal atrial fibrillation diagnosed in 2022.   CHA2DS2-VASc score is 3.  She had a breakthrough episode requiring DCCV in February as discussed above, asymptomatic at this time.  Continue Eliquis 5 mg twice daily for stroke prophylaxis.  She is also on Toprol-XL 12.5 mg in the morning and Cardizem CD 240 mg in the evening.  Refill provided for Toprol-XL.  No changes were made otherwise.   2.  Primary hypertension.  Also suspect whitecoat hypertension.  I recommended that she continue to track blood pressure at home, bring in recordings for 1 week prior to her next visit.  Otherwise keep follow-up with PCP.  Disposition:  Follow up  6 months.  Signed, Gerard Knight, M.D., F.A.C.C. [facility]

## 2023-05-31 NOTE — Patient Instructions (Signed)
 Medication Instructions:  Your physician recommends that you continue on your current medications as directed. Please refer to the Current Medication list given to you today.   Labwork: None today  Testing/Procedures: None today  Follow-Up: 6 months  Any Other Special Instructions Will Be Listed Below (If Applicable).  If you need a refill on your cardiac medications before your next appointment, please call your pharmacy.

## 2023-06-06 ENCOUNTER — Other Ambulatory Visit: Payer: Self-pay | Admitting: Nurse Practitioner

## 2023-06-07 ENCOUNTER — Encounter: Payer: Self-pay | Admitting: Nurse Practitioner

## 2023-06-07 ENCOUNTER — Ambulatory Visit (INDEPENDENT_AMBULATORY_CARE_PROVIDER_SITE_OTHER): Payer: Medicare Other | Admitting: Nurse Practitioner

## 2023-06-07 VITALS — BP 140/66 | HR 72 | Ht 65.0 in | Wt 262.6 lb

## 2023-06-07 DIAGNOSIS — R768 Other specified abnormal immunological findings in serum: Secondary | ICD-10-CM | POA: Diagnosis not present

## 2023-06-07 DIAGNOSIS — E0590 Thyrotoxicosis, unspecified without thyrotoxic crisis or storm: Secondary | ICD-10-CM | POA: Diagnosis not present

## 2023-06-07 DIAGNOSIS — R7989 Other specified abnormal findings of blood chemistry: Secondary | ICD-10-CM

## 2023-06-07 NOTE — Progress Notes (Signed)
 06/07/2023     Endocrinology Follow Up Note    Subjective:    Patient ID: [name], female    DOB: 01/19/1951, PCP Minus Amel, MD.   Past Medical History:  Diagnosis Date   Arthritis    CHF (congestive heart failure) (HCC)    Hypertension    Lumbar disc herniation    hx. of. surgery 2'15   PAF (paroxysmal atrial fibrillation) (HCC)    a. diagnosed during admission in 12/2020 and spontaneously converted back to NSR    Past Surgical History:  Procedure Laterality Date   ABDOMINAL HYSTERECTOMY     CARDIOVERSION N/A 03/31/2023   Procedure: CARDIOVERSION;  Surgeon: Lasalle Pointer, MD;  Location: [facility];  Service: Cardiovascular;  Laterality: N/A;   HEMI-MICRODISCECTOMY LUMBAR LAMINECTOMY LEVEL 1 Left 04/04/2013   Procedure: HEMI-  LAMINECTOMY MICRODISCECTOMY OF L5 -S1 ON LEFT  ;  Surgeon: Florencia Hunter, MD;  Location: [facility];  Service: Orthopedics;  Laterality: Left;   JOINT REPLACEMENT Right 2012   knee   JOINT REPLACEMENT Right 2010   hip   TOTAL HIP ARTHROPLASTY Left 09/24/2013   Procedure: LEFT TOTAL HIP ARTHROPLASTY ANTERIOR APPROACH;  Surgeon: Bevin Bucks, MD;  Location: [facility];  Service: Orthopedics;  Laterality: Left;    Social History   Socioeconomic History   Marital status: Married    Spouse name: Not on file   Number of children: Not on file   Years of education: Not on file   Highest education level: Not on file  Occupational History   Not on file  Tobacco Use   Smoking status: Never   Smokeless tobacco: Never  Vaping Use   Vaping status: Never Used  Substance and Sexual Activity   Alcohol use: No   Drug use: No   Sexual activity: Yes  Other Topics Concern   Not on file  Social History Narrative   Not on file   Social Drivers of Health   Financial Resource Strain: Not on file  Food Insecurity: No Food Insecurity (03/30/2023)   Hunger Vital Sign    Worried About Running Out of Food in the Last Year: Never true    Ran  Out of Food in the Last Year: Never true  Transportation Needs: No Transportation Needs (03/30/2023)   PRAPARE - Administrator, Civil Service (Medical): No    Lack of Transportation (Non-Medical): No  Physical Activity: Not on file  Stress: Not on file  Social Connections: Moderately Isolated (03/30/2023)   Social Connection and Isolation Panel [NHANES]    Frequency of Communication with Friends and Family: Once a week    Frequency of Social Gatherings with Friends and Family: More than three times a week    Attends Religious Services: Never    Database administrator or Organizations: No    Attends Banker Meetings: Never    Marital Status: Married    History reviewed. No pertinent family history.  Outpatient Encounter Medications as of 06/07/2023  Medication Sig   acetaminophen  (TYLENOL ) 500 MG tablet Take 1,000 mg by mouth every 6 (six) hours as needed for moderate pain (pain score 4-6).   apixaban  (ELIQUIS ) 5 MG TABS tablet Take 1 tablet (5 mg total) by mouth 2 (two) times daily.   calcium carbonate (TUMS - DOSED IN MG ELEMENTAL CALCIUM) 500 MG chewable tablet Chew 1 tablet by mouth daily.   diltiazem  (CARDIZEM  CD) 240 MG 24 hr capsule  Take 240 mg by mouth at bedtime.   furosemide  (LASIX ) 20 MG tablet Take 1 tablet (20 mg total) by mouth daily. (Patient taking differently: Take 20 mg by mouth daily.)   metoprolol  succinate (TOPROL  XL) 25 MG 24 hr tablet Take 0.5 tablets (12.5 mg total) by mouth daily.   [DISCONTINUED] methimazole  (TAPAZOLE ) 5 MG tablet Take 0.5 tablets (2.5 mg total) by mouth daily.   No facility-administered encounter medications on file as of 06/07/2023.    ALLERGIES: No Known Allergies  VACCINATION STATUS: Immunization History  Administered Date(s) Administered   Fluad Quad(high Dose 65+) 02/04/2021     HPI  [name] is 73 y.o. female who presents today with a medical history as above. she is being seen in follow up after  being seen in consultation for hyperthyroidism requested by Minus Amel, MD.   Initially she was started on Levothyroxine over a year ago for hypothyroidism, with gradual increases in Levothyroxine dosage (highest being 75 mcg).  She has since been tapered back off completely due to suppressed TSH. she has been dealing with symptoms of weight loss (attributes this to Metformin), alternating constipation/diarrhea, fatigue, dry skin, joint aches and heat intolerance since about April 2024. These symptoms are progressively worsening and troubling to her.  her most recent thyroid  labs revealed suppressed TSH of 0.011 on 08/29/22 at which time her Levothyroxine was completely stopped.  she denies dysphagia, choking, shortness of breath, no recent voice change.    she does have family history of thyroid  dysfunction in her aunt (goiter), but denies family hx of thyroid  cancer. she denies personal history of goiter. she is not on any anti-thyroid  medications nor on any thyroid  hormone supplements. Denies use of Biotin containing supplements.  she is willing to proceed with appropriate work up and therapy for thyrotoxicosis.   Review of systems  Constitutional: +increasing body weight-r/t Methimazole  treatment?, current Body mass index is 43.7 kg/m., no fatigue, + subjective hyperthermia-improved, no subjective hypothermia Eyes: no blurry vision, no xerophthalmia ENT: no sore throat, no nodules palpated in throat, no dysphagia/odynophagia, no hoarseness Cardiovascular: no chest pain, no shortness of breath, no palpitations, no leg swelling Respiratory: no cough, no shortness of breath Gastrointestinal: no nausea/vomiting, alternating constipation/diarrhea Musculoskeletal: + diffuse muscle/joint aches-walks with cane Skin: no rashes, no hyperemia Neurological: no tremors, no numbness, no tingling, no dizziness Psychiatric: no depression, no anxiety   Objective:    BP (!) 140/66 Comment: L arm with  manual cuff. Shanasia Ibrahim,FNP made aware.  Pulse 72   Ht 5\' 5"  (1.651 m)   Wt 262 lb 9.6 oz (119.1 kg)   BMI 43.70 kg/m   Wt Readings from Last 3 Encounters:  06/07/23 262 lb 9.6 oz (119.1 kg)  05/31/23 262 lb (118.8 kg)  03/31/23 246 lb 11.1 oz (111.9 kg)     BP Readings from Last 3 Encounters:  06/07/23 (!) 140/66  05/31/23 (!) 150/78  03/31/23 (!) 118/58                        Physical Exam- Limited  Constitutional:  Body mass index is 43.7 kg/m. , not in acute distress, mildly anxious state of mind Eyes:  EOMI, no exophthalmos Musculoskeletal: no gross deformities, strength intact in all four extremities, no gross restriction of joint movements Skin:  no rashes, no hyperemia Neurological: no tremor with outstretched hands   CMP     Component Value Date/Time   NA 136 03/31/2023 0507  K 3.7 03/31/2023 0507   CL 105 03/31/2023 0507   CO2 22 03/31/2023 0507   GLUCOSE 138 (H) 03/31/2023 0507   BUN 9 03/31/2023 0507   CREATININE 0.58 03/31/2023 0507   CALCIUM 8.5 (L) 03/31/2023 0507   PROT 8.0 03/30/2023 1105   ALBUMIN 4.2 03/30/2023 1105   AST 24 03/30/2023 1105   ALT 17 03/30/2023 1105   ALKPHOS 82 03/30/2023 1105   BILITOT 0.7 03/30/2023 1105   GFRNONAA >60 03/31/2023 0507     CBC    Component Value Date/Time   WBC 9.8 03/31/2023 0507   RBC 4.87 03/31/2023 0507   HGB 13.1 03/31/2023 0507   HCT 40.7 03/31/2023 0507   PLT 251 03/31/2023 0507   MCV 83.6 03/31/2023 0507   MCH 26.9 03/31/2023 0507   MCHC 32.2 03/31/2023 0507   RDW 17.1 (H) 03/31/2023 0507   LYMPHSABS 2.0 03/30/2023 1105   MONOABS 1.2 (H) 03/30/2023 1105   EOSABS 0.1 03/30/2023 1105   BASOSABS 0.1 03/30/2023 1105     Diabetic Labs (most recent): Lab Results  Component Value Date   HGBA1C 7.4 (H) 01/01/2021    Lipid Panel  No results found for: "CHOL", "TRIG", "HDL", "CHOLHDL", "VLDL", "LDLCALC", "LDLDIRECT", "LABVLDL"   Lab Results  Component Value Date   TSH 3.790  05/29/2023   TSH 9.739 (H) 03/30/2023   TSH 10.100 (H) 03/21/2023   TSH 0.006 (L) 11/28/2022   TSH 0.01 (A) 08/29/2022   TSH 0.01 (A) 07/14/2022   TSH 0.02 (A) 05/20/2022   TSH 2.74 01/19/2022   TSH 10.791 (H) 01/01/2021   FREET4 1.08 05/29/2023   FREET4 0.85 03/30/2023   FREET4 0.87 03/21/2023   FREET4 1.85 (H) 11/28/2022      Latest Reference Range & Units 08/29/22 00:00 11/28/22 14:01 03/21/23 10:49 03/30/23 11:05 03/30/23 13:09 05/29/23 09:41  TSH 0.450 - 4.500 uIU/mL 0.01 ! (E) 0.006 (L) 10.100 (H) 9.739 (H)  3.790  Triiodothyronine,Free,Serum 2.0 - 4.4 pg/mL  5.8 (H) 3.4  3.5 3.7  T4,Free(Direct) 0.82 - 1.77 ng/dL  4.09 (H) 8.11  9.14 7.82  Thyroperoxidase Ab SerPl-aCnc 0 - 34 IU/mL  40 (H)      Thyroglobulin Antibody 0.0 - 0.9 IU/mL  <1.0      !: Data is abnormal (L): Data is abnormally low (H): Data is abnormally high (E): External lab result   Assessment & Plan:   1. Abnormal TSH 2. Hyperthyroidism  she is being seen at a kind request of Minus Amel, MD.  her history and most recent labs are reviewed, and she was examined clinically. Subjective and objective findings are inconsistent with thyrotoxicosis from primary hyperthyroidism. The potential risks of untreated thyrotoxicosis and the need for definitive therapy have been discussed in detail with her, and she agrees to proceed with diagnostic workup and treatment plan.   She was started on Methimazole  between visits, had to take care of her husbands health issues acutely, and was unable to move forward with the uptake and scan as quickly as hoped.  Since then, we have slowly tapered her down, currently on Methimazole  2.5 mg daily (reduced during recent hospitalization where she had cardioversion.    Her recent thyroid  function tests show favorable response to the Methimazole , so much so that we can stop the medication and proceed with uptake and scan.  I will call her with the results of the uptake and scan and  what the next steps are.   Options of therapy are discussed  with her.  We discussed the option of treating it with medications including methimazole  or PTU which may have side effects including rash, transaminitis, and bone marrow suppression.  We also discussed the option of definitive therapy with RAI ablation of the thyroid . If she is found to have primary hyperthyroidism from Graves' disease , toxic multinodular goiter or toxic nodular goiter the preferred modality of treatment would be I-131 thyroid  ablation. Surgery is another choice of treatment in some cases, in her case surgery is not a good fit for presentation with only mild goiter.  -Patient is made aware of the high likelihood of post ablative hypothyroidism with subsequent need for lifelong thyroid  hormone replacement. sheunderstands this outcome and she is  willing to proceed.        -Patient is advised to maintain close follow up with Minus Amel, MD for primary care needs.   I spent  38  minutes in the care of the patient today including review of labs from Thyroid  Function, CMP, and other relevant labs ; imaging/biopsy records (current and previous including abstractions from other facilities); face-to-face time discussing  her lab results and symptoms, medications doses, her options of short and long term treatment based on the latest standards of care / guidelines;   and documenting the encounter.  Osa Blase Crammer  participated in the discussions, expressed understanding, and voiced agreement with the above plans.  All questions were answered to her satisfaction. she is encouraged to contact clinic should she have any questions or concerns prior to her return visit.  Follow up plan: Return will call with uptake and scan results and next steps.   Thank you for involving me in the care of this pleasant patient, and I will continue to update you with her progress.   Hulon Magic, [facility] [address] [place], [place] [address] Phone: [phone] Fax: [phone]  06/07/2023, 2:02 PM

## 2023-06-20 ENCOUNTER — Encounter (HOSPITAL_COMMUNITY)
Admission: RE | Admit: 2023-06-20 | Discharge: 2023-06-20 | Disposition: A | Discharge status: Home / Self Care | Source: Ambulatory Visit | Attending: Nurse Practitioner | Admitting: Nurse Practitioner

## 2023-06-20 ENCOUNTER — Encounter (HOSPITAL_COMMUNITY): Payer: Self-pay

## 2023-06-20 DIAGNOSIS — E0590 Thyrotoxicosis, unspecified without thyrotoxic crisis or storm: Secondary | ICD-10-CM | POA: Diagnosis not present

## 2023-06-20 DIAGNOSIS — R768 Other specified abnormal immunological findings in serum: Secondary | ICD-10-CM | POA: Diagnosis not present

## 2023-06-20 DIAGNOSIS — R7989 Other specified abnormal findings of blood chemistry: Secondary | ICD-10-CM | POA: Diagnosis not present

## 2023-06-20 MED ORDER — SODIUM IODIDE I-123 7.4 MBQ CAPS
500.0000 | ORAL_CAPSULE | Freq: Once | ORAL | Status: AC
  Administered 2023-06-20: 449 via ORAL

## 2023-06-21 ENCOUNTER — Encounter (HOSPITAL_COMMUNITY)
Admission: RE | Admit: 2023-06-21 | Discharge: 2023-06-21 | Disposition: A | Discharge status: Home / Self Care | Source: Ambulatory Visit | Attending: Nurse Practitioner | Admitting: Nurse Practitioner

## 2023-06-21 DIAGNOSIS — E213 Hyperparathyroidism, unspecified: Secondary | ICD-10-CM | POA: Diagnosis not present

## 2023-06-21 DIAGNOSIS — E041 Nontoxic single thyroid nodule: Secondary | ICD-10-CM | POA: Diagnosis not present

## 2023-06-21 NOTE — Progress Notes (Signed)
 Please call patient and let her know that the uptake and scan results are in.  It shows normal 4 hr uptake and normal 24 hr uptake, which means her period of over-activity was likely acute inflammation.    However, they did identify a cold nodule in the right thyroid  gland.  This means it did not soak up any of the radioactive iodine.  They recommended getting a thyroid  ultrasound to assess the nodule more clearly.  They will look at size, shape, consistency, and whether or not they can pass ultrasound waves through it.  The purpose of this is to make sure it is not risky for cancer.  If she is willing to proceed with this, I can put the order in.  Just let me know.

## 2023-06-22 ENCOUNTER — Telehealth: Payer: Self-pay | Admitting: *Deleted

## 2023-06-22 DIAGNOSIS — E041 Nontoxic single thyroid nodule: Secondary | ICD-10-CM

## 2023-06-22 NOTE — Telephone Encounter (Signed)
 Noted.

## 2023-06-22 NOTE — Telephone Encounter (Signed)
 I entered the order.

## 2023-06-22 NOTE — Telephone Encounter (Signed)
 Patient was called and given the results of her uptakes , per Whitney Reardon,NP. Patient is in an agreement to move forward with the thyroid  ultrasound to follow up on the cold nodule. She was advised that a order would be sent in to [facility] and if she had not heard from them by middle of next week to reach out to us .

## 2023-06-22 NOTE — Telephone Encounter (Signed)
-----   Message from Wendel Hals sent at 06/21/2023  1:10 PM EDT ----- Please call patient and let her know that the uptake and scan results are in.  It shows normal 4 hr uptake and normal 24 hr uptake, which means her period of over-activity was likely acute inflammation.    However, they did identify a cold nodule in the right thyroid  gland.  This means it did not soak up any of the radioactive iodine.  They recommended getting a thyroid  ultrasound to assess the nodule more clearly.  They will look at size, shape, consistency, and whether or not they can pass ultrasound waves through it.  The purpose of this is to make sure it is not risky for cancer.  If she is willing to proceed with this, I can put the order in.  Just let me know.

## 2023-07-04 ENCOUNTER — Ambulatory Visit (HOSPITAL_COMMUNITY)
Admission: RE | Admit: 2023-07-04 | Discharge: 2023-07-04 | Disposition: A | Discharge status: Home / Self Care | Source: Ambulatory Visit | Attending: Nurse Practitioner | Admitting: Nurse Practitioner

## 2023-07-04 DIAGNOSIS — E041 Nontoxic single thyroid nodule: Secondary | ICD-10-CM | POA: Diagnosis not present

## 2023-07-04 DIAGNOSIS — E0590 Thyrotoxicosis, unspecified without thyrotoxic crisis or storm: Secondary | ICD-10-CM | POA: Diagnosis not present

## 2023-07-11 ENCOUNTER — Ambulatory Visit: Payer: Self-pay | Admitting: Nurse Practitioner

## 2023-07-11 DIAGNOSIS — E041 Nontoxic single thyroid nodule: Secondary | ICD-10-CM

## 2023-07-11 NOTE — Progress Notes (Signed)
 Please call patient and let her know that the results of her thyroid  ultrasound show a mildly suspicious 3.2 cm nodule taking up the majority of the right thyroid  gland.  It does meet criteria for biopsy to help rule out cancer.  I recommend proceeding with that.  If she wishes not to do the biopsy, we can always repeat the ultrasound in 6 months to see if the nodule has changed in characteristics and then re-evaluate at that time.

## 2023-07-12 NOTE — Progress Notes (Signed)
 Noted

## 2023-07-12 NOTE — Progress Notes (Signed)
 Sounds good.  I will put that order in to be done in November of this year.

## 2023-07-12 NOTE — Progress Notes (Signed)
 Patient was called and given results per [facility] instruction. Patient states that she would like to repeat the ultrasound in 6 months to see if the nodule has changed in characteristics and then re-evaluate at that time.

## 2023-07-12 NOTE — Telephone Encounter (Signed)
-----   Message from Wendel Hals sent at 07/11/2023  7:52 AM EDT ----- Please call patient and let her know that the results of her thyroid  ultrasound show a mildly suspicious 3.2 cm nodule taking up the majority of the right thyroid  gland.  It does meet criteria for biopsy to help rule out cancer.  I recommend proceeding with that.  If she wishes not to do the biopsy, we can always repeat the ultrasound in 6 months to see if the nodule has changed in characteristics and then re-evaluate at that time.

## 2023-07-12 NOTE — Telephone Encounter (Signed)
 Patient was called and given results per [facility] instruction. Patient states that she would like to repeat the ultrasound in 6 months to see if the nodule has changed in characteristics and then re-evaluate at that time.

## 2023-07-15 DIAGNOSIS — I4891 Unspecified atrial fibrillation: Secondary | ICD-10-CM | POA: Diagnosis not present

## 2023-07-15 DIAGNOSIS — I10 Essential (primary) hypertension: Secondary | ICD-10-CM | POA: Diagnosis not present

## 2023-07-15 DIAGNOSIS — D6869 Other thrombophilia: Secondary | ICD-10-CM | POA: Diagnosis not present

## 2023-08-14 ENCOUNTER — Encounter (HOSPITAL_COMMUNITY): Payer: Self-pay

## 2023-08-14 ENCOUNTER — Other Ambulatory Visit: Payer: Self-pay

## 2023-08-14 ENCOUNTER — Emergency Department (HOSPITAL_COMMUNITY)

## 2023-08-14 ENCOUNTER — Emergency Department (HOSPITAL_COMMUNITY)
Admission: EM | Admit: 2023-08-14 | Discharge: 2023-08-14 | Disposition: A | Discharge status: Home / Self Care | Attending: Emergency Medicine | Admitting: Emergency Medicine

## 2023-08-14 DIAGNOSIS — I4891 Unspecified atrial fibrillation: Secondary | ICD-10-CM | POA: Insufficient documentation

## 2023-08-14 DIAGNOSIS — R7989 Other specified abnormal findings of blood chemistry: Secondary | ICD-10-CM | POA: Diagnosis not present

## 2023-08-14 DIAGNOSIS — Z7901 Long term (current) use of anticoagulants: Secondary | ICD-10-CM | POA: Insufficient documentation

## 2023-08-14 DIAGNOSIS — I509 Heart failure, unspecified: Secondary | ICD-10-CM | POA: Insufficient documentation

## 2023-08-14 DIAGNOSIS — R002 Palpitations: Secondary | ICD-10-CM | POA: Diagnosis present

## 2023-08-14 DIAGNOSIS — Z79899 Other long term (current) drug therapy: Secondary | ICD-10-CM | POA: Insufficient documentation

## 2023-08-14 DIAGNOSIS — I110 Hypertensive heart disease with heart failure: Secondary | ICD-10-CM | POA: Insufficient documentation

## 2023-08-14 DIAGNOSIS — R0602 Shortness of breath: Secondary | ICD-10-CM | POA: Diagnosis not present

## 2023-08-14 LAB — BASIC METABOLIC PANEL WITH GFR
Anion gap: 12 (ref 5–15)
BUN: 13 mg/dL (ref 8–23)
CO2: 24 mmol/L (ref 22–32)
Calcium: 9 mg/dL (ref 8.9–10.3)
Chloride: 100 mmol/L (ref 98–111)
Creatinine, Ser: 0.66 mg/dL (ref 0.44–1.00)
GFR, Estimated: >60 mL/min (ref >60)
Glucose, Bld: 169 mg/dL — ABNORMAL HIGH (ref 70–99)
Potassium: 3.6 mmol/L (ref 3.5–5.1)
Sodium: 136 mmol/L (ref 135–145)

## 2023-08-14 LAB — CBC WITH DIFFERENTIAL/PLATELET
Abs Immature Granulocytes: 0.03 10*3/uL (ref 0.00–0.07)
Basophils Absolute: 0.1 10*3/uL (ref 0.0–0.1)
Basophils Relative: 1 %
Eosinophils Absolute: 0.1 10*3/uL (ref 0.0–0.5)
Eosinophils Relative: 1 %
HCT: 43 % (ref 36.0–46.0)
Hemoglobin: 13.5 g/dL (ref 12.0–15.0)
Immature Granulocytes: 0 %
Lymphocytes Relative: 15 %
Lymphs Abs: 1.3 10*3/uL (ref 0.7–4.0)
MCH: 26.9 pg (ref 26.0–34.0)
MCHC: 31.4 g/dL (ref 30.0–36.0)
MCV: 85.8 fL (ref 80.0–100.0)
Monocytes Absolute: 0.7 10*3/uL (ref 0.1–1.0)
Monocytes Relative: 8 %
Neutro Abs: 6.6 10*3/uL (ref 1.7–7.7)
Neutrophils Relative %: 75 %
Platelets: 295 10*3/uL (ref 150–400)
RBC: 5.01 MIL/uL (ref 3.87–5.11)
RDW: 15.7 % — ABNORMAL HIGH (ref 11.5–15.5)
WBC: 8.7 10*3/uL (ref 4.0–10.5)
nRBC: 0 % (ref 0.0–0.2)

## 2023-08-14 LAB — MAGNESIUM: Magnesium: 2 mg/dL (ref 1.7–2.4)

## 2023-08-14 LAB — BRAIN NATRIURETIC PEPTIDE: B Natriuretic Peptide: 336 pg/mL — ABNORMAL HIGH (ref 0.0–100.0)

## 2023-08-14 LAB — TSH: TSH: 2.328 u[IU]/mL (ref 0.350–4.500)

## 2023-08-14 MED ORDER — METOPROLOL TARTRATE 5 MG/5ML IV SOLN
5.0000 mg | Freq: Once | INTRAVENOUS | Status: AC
  Administered 2023-08-14: 5 mg via INTRAVENOUS
  Filled 2023-08-14: qty 5

## 2023-08-14 MED ORDER — ONDANSETRON HCL 4 MG/2ML IJ SOLN
4.0000 mg | Freq: Once | INTRAMUSCULAR | Status: AC
  Administered 2023-08-14: 4 mg via INTRAVENOUS
  Filled 2023-08-14: qty 2

## 2023-08-14 MED ORDER — ETOMIDATE 2 MG/ML IV SOLN
0.1000 mg/kg | Freq: Once | INTRAVENOUS | Status: AC
  Administered 2023-08-14: 11.88 mg via INTRAVENOUS
  Filled 2023-08-14: qty 10

## 2023-08-14 MED ORDER — POTASSIUM CHLORIDE CRYS ER 20 MEQ PO TBCR
40.0000 meq | EXTENDED_RELEASE_TABLET | Freq: Once | ORAL | Status: AC
  Administered 2023-08-14: 40 meq via ORAL
  Filled 2023-08-14: qty 2

## 2023-08-14 MED ORDER — FUROSEMIDE 10 MG/ML IJ SOLN
20.0000 mg | Freq: Once | INTRAMUSCULAR | Status: AC
  Administered 2023-08-14: 20 mg via INTRAVENOUS
  Filled 2023-08-14: qty 2

## 2023-08-14 NOTE — ED Provider Notes (Signed)
 [place] EMERGENCY DEPARTMENT AT [facility] Provider Note   CSN: [id number] Arrival date & time: 08/14/23  9344     Patient presents with: Palpitations   [name] is a 73 y.o. female.   HPI Patient presents for palpitations.  Medical history includes atrial fibrillation, CHF, HTN, arthritis.  She has had intermittent atrial fibrillation for the past 2 years.  Current home medications include Eliquis , Cardizem , metoprolol , Lasix .  She has been adherent to all medications.  She takes her long-acting diltiazem  at nighttime.  She takes her metoprolol  in the morning.  She did receive her morning medications today.  Starting at 7:15 PM last night, she had onset of palpitations.  She was monitoring her heart rate throughout the evening and night.  It remained elevated in the range of 120s to 140s.  She endorses some mild shortness of breath but denies any other associated symptoms.  She was admitted in February of this year with atrial fibrillation with RVR.  She underwent cardioversion at that time.    Prior to Admission medications   Medication Sig Start Date End Date Taking? Authorizing Provider  acetaminophen  (TYLENOL ) 500 MG tablet Take 1,000 mg by mouth every 6 (six) hours as needed for moderate pain (pain score 4-6).    [provider]  apixaban  (ELIQUIS ) 5 MG TABS tablet Take 1 tablet (5 mg total) by mouth 2 (two) times daily. 01/01/21   Maree, Pratik D, DO  calcium carbonate (TUMS - DOSED IN MG ELEMENTAL CALCIUM) 500 MG chewable tablet Chew 1 tablet by mouth daily.    [provider]  diltiazem  (CARDIZEM  CD) 240 MG 24 hr capsule Take 240 mg by mouth at bedtime. 02/28/22   [provider]  furosemide  (LASIX ) 20 MG tablet Take 1 tablet (20 mg total) by mouth daily. Patient taking differently: Take 20 mg by mouth daily. 01/01/21 05/31/23  Maree, Pratik D, DO  metoprolol  succinate (TOPROL  XL) 25 MG 24 hr tablet Take 0.5 tablets (12.5 mg total) by  mouth daily. 05/31/23 05/30/24  Debera Jayson MATSU, MD    Allergies: Patient has no known allergies.    Review of Systems  Respiratory:  Positive for shortness of breath.   Cardiovascular:  Positive for palpitations.  All other systems reviewed and are negative.   Updated Vital Signs BP 121/61   Pulse 69   Temp 98.1 F (36.7 C) (Oral)   Resp 17   Ht 5' 5 (1.651 m)   Wt 118.8 kg   SpO2 94%   BMI 43.60 kg/m   Physical Exam Vitals and nursing note reviewed.  Constitutional:      General: She is not in acute distress.    Appearance: Normal appearance. She is well-developed. She is not ill-appearing, toxic-appearing or diaphoretic.  HENT:     Head: Normocephalic and atraumatic.     Right Ear: External ear normal.     Left Ear: External ear normal.     Nose: Nose normal.     Mouth/Throat:     Mouth: Mucous membranes are moist.   Eyes:     Extraocular Movements: Extraocular movements intact.     Conjunctiva/sclera: Conjunctivae normal.    Cardiovascular:     Rate and Rhythm: Tachycardia present. Rhythm irregular.  Pulmonary:     Effort: Pulmonary effort is normal. No respiratory distress.     Breath sounds: No wheezing.  Abdominal:     General: There is no distension.     Palpations: Abdomen  is soft.     Tenderness: There is no abdominal tenderness.   Musculoskeletal:        General: No swelling. Normal range of motion.     Cervical back: Normal range of motion and neck supple.   Skin:    General: Skin is warm and dry.     Coloration: Skin is not jaundiced or pale.   Neurological:     General: No focal deficit present.     Mental Status: She is alert and oriented to person, place, and time.   Psychiatric:        Mood and Affect: Mood normal.        Behavior: Behavior normal.     (all labs ordered are listed, but only abnormal results are displayed) Labs Reviewed  BRAIN NATRIURETIC PEPTIDE - Abnormal; Notable for the following components:      Result  Value   B Natriuretic Peptide 336.0 (*)    All other components within normal limits  CBC WITH DIFFERENTIAL/PLATELET - Abnormal; Notable for the following components:   RDW 15.7 (*)    All other components within normal limits  BASIC METABOLIC PANEL WITH GFR - Abnormal; Notable for the following components:   Glucose, Bld 169 (*)    All other components within normal limits  MAGNESIUM   TSH    EKG: EKG Interpretation Date/Time:  Monday August 14 2023 07:10:29 EDT Ventricular Rate:  142 PR Interval:    QRS Duration:  91 QT Interval:  309 QTC Calculation: 475 R Axis:   -50  Text Interpretation: Atrial fibrillation Left anterior fascicular block Abnormal R-wave progression, late transition LVH with secondary repolarization abnormality Baseline wander in lead(s) V1 V2 V3 V4 V5 V6 Confirmed by Melvenia Motto [phone]) on 08/14/2023 8:26:53 AM  Radiology: [facility] Chest Port 1 View Result Date: 08/14/2023 CLINICAL DATA:  Shortness of breath. EXAM: PORTABLE CHEST 1 VIEW COMPARISON:  03/30/2023 FINDINGS: The cardio pericardial silhouette is enlarged. The lungs are clear without focal pneumonia, edema, pneumothorax or pleural effusion. No acute bony abnormality. Telemetry leads overlie the chest. IMPRESSION: Enlargement of the cardiopericardial silhouette without acute cardiopulmonary findings. Electronically Signed   By: Camellia Candle M.D.   On: 08/14/2023 08:00     .Sedation  Date/Time: 08/14/2023 9:56 AM  Performed by: Melvenia Motto, MD Authorized by: Melvenia Motto, MD   Consent:    Consent obtained:  Verbal   Consent given by:  Patient   Risks discussed:  Inadequate sedation, nausea, vomiting and allergic reaction Universal protocol:    Procedure explained and questions answered to patient or proxy's satisfaction: yes     Immediately prior to procedure, a time out was called: yes     Patient identity confirmed:  Verbally with patient Indications:    Procedure performed:  Cardioversion   Procedure  necessitating sedation performed by:  Physician performing sedation Pre-sedation assessment:    Time since last food or drink:  16 hours   ASA classification: class 3 - patient with severe systemic disease     Mouth opening:  2 finger widths   Thyromental distance:  3 finger widths   Mallampati score:  III - soft palate, base of uvula visible   Neck mobility: normal     Pre-sedation assessments completed and reviewed: airway patency, cardiovascular function, hydration status, mental status, nausea/vomiting, pain level and respiratory function   A pre-sedation assessment was completed prior to the start of the procedure Immediate pre-procedure details:    Reviewed: vital signs, relevant  labs/tests and NPO status     Verified: bag valve mask available, emergency equipment available, intubation equipment available, IV patency confirmed, oxygen available and suction available   Procedure details (see MAR for exact dosages):    Preoxygenation:  Nasal cannula   Sedation:  Etomidate   Intended level of sedation: moderate (conscious sedation)   Analgesia:  None   Intra-procedure monitoring:  Blood pressure monitoring, cardiac monitor, continuous capnometry, continuous pulse oximetry, frequent LOC assessments and frequent vital sign checks   Intra-procedure events: none     Total Provider sedation time (minutes):  12 Post-procedure details:   A post-sedation assessment was completed following the completion of the procedure.   Attendance: Constant attendance by certified staff until patient recovered     Recovery: Patient returned to pre-procedure baseline     Post-sedation assessments completed and reviewed: airway patency, cardiovascular function, hydration status, mental status, nausea/vomiting, pain level and respiratory function     Patient is stable for discharge or admission: yes     Procedure completion:  Tolerated well, no immediate complications .Cardioversion  Date/Time: 08/14/2023  9:57 AM  Performed by: Melvenia Motto, MD Authorized by: Melvenia Motto, MD   Consent:    Consent obtained:  Verbal   Consent given by:  Patient   Risks discussed:  Induced arrhythmia, pain and death   Alternatives discussed:  Rate-control medication Pre-procedure details:    Cardioversion basis:  Elective   Rhythm:  Atrial fibrillation   Electrode placement:  Anterior-posterior Patient sedated: Yes. Refer to sedation procedure documentation for details of sedation.  Attempt one:    Cardioversion mode:  Synchronous   Waveform:  Biphasic   Shock (Joules):  200   Shock outcome:  Conversion to normal sinus rhythm Post-procedure details:    Patient status:  Awake   Patient tolerance of procedure:  Tolerated well, no immediate complications    Medications Ordered in the ED  metoprolol  tartrate (LOPRESSOR ) injection 5 mg (5 mg Intravenous Given 08/14/23 0740)  potassium chloride  SA (KLOR-CON  M) CR tablet 40 mEq (40 mEq Oral Given 08/14/23 1022)  furosemide  (LASIX ) injection 20 mg (20 mg Intravenous Given 08/14/23 0910)  ondansetron  (ZOFRAN ) injection 4 mg (4 mg Intravenous Given 08/14/23 0910)  etomidate (AMIDATE) injection 11.88 mg (11.88 mg Intravenous Given 08/14/23 9062)                                    Medical Decision Making Amount and/or Complexity of Data Reviewed Labs: ordered. Radiology: ordered.  Risk Prescription drug management.   This patient presents to the ED for concern of palpitations, this involves an extensive number of treatment options, and is a complaint that carries with it a high risk of complications and morbidity.  The differential diagnosis includes arrhythmia, dehydration, polypharmacy, anxiety   Co morbidities / Chronic conditions that complicate the patient evaluation  atrial fibrillation, CHF, HTN, arthritis   Additional history obtained:  Additional history obtained from EMR External records from outside source obtained and reviewed including  N/A   Lab Tests:  I Ordered, and personally interpreted labs.  The pertinent results include: Normal kidney function, normal electrolytes, moderate elevation in BNP   Imaging Studies ordered:  I ordered imaging studies including chest x-ray I independently visualized and interpreted imaging which showed enlarged cardiac silhouette without acute findings I agree with the radiologist interpretation   Cardiac Monitoring: / EKG:  The patient was maintained  on a cardiac monitor.  I personally viewed and interpreted the cardiac monitored which showed an underlying rhythm of: Atrial fibrillation with RVR, converted to sinus rhythm   Problem List / ED Course / Critical interventions / Medication management  Patient presenting for palpitations and shortness of breath.  Onset was 7:15 PM last night.  She has been monitoring her heart rate overnight and it has been elevated in the range of 120s to 140s.  On arrival, monitor confirms atrial fibrillation with RVR.  She does have a history of the same.  She has been adherent to her home medications of Eliquis , diltiazem , metoprolol .  She is overall well-appearing on exam.  Her breathing is unlabored and she is able to speak in complete sentences.  Blood pressure is moderately elevated at this time.  Management options were discussed with patient.  Patient prefers cardioversion.  Will trial dose of metoprolol .  Laboratory workup was initiated.  Lab work was notable for low-normal potassium and moderate elevation in BNP.  Potassium chloride  was given for electrolyte optimization.  Dose of IV Lasix  was given.  I spoke with cardiologist on-call, Dr. Alvan, who agrees with elective cardioversion.  Patient is very much in favor of this as well.  Patient was pretreated with Zofran .  Etomidate was given for sedation.  She underwent cardioversion with 200 J of biphasic synchronized cardioversion.  She converted to normal sinus rhythm.  On reassessment, patient  remains in normal sinus rhythm.  She states that her symptoms have all resolved.  SpO2 remains normal on room air.  She was discharged in stable condition. I ordered medication including metoprolol  for rate control; potassium chloride  for electrolyte optimization; Lasix  for diuresis; etomidate for procedural sedation Reevaluation of the patient after these medicines showed that the patient improved I have reviewed the patients home medicines and have made adjustments as needed   Consultations Obtained:  I requested consultation with the cardiologist, Dr. Alvan,  and discussed lab and imaging findings as well as pertinent plan - they recommend: ED cardioversion, discharged with follow-up if successful   Social Determinants of Health:  Has access to outpatient care  CRITICAL CARE Performed by: Bernardino Fireman   Total critical care time: 31 minutes  Critical care time was exclusive of separately billable procedures and treating other patients.  Critical care was necessary to treat or prevent imminent or life-threatening deterioration.  Critical care was time spent personally by me on the following activities: development of treatment plan with patient and/or surrogate as well as nursing, discussions with consultants, evaluation of patient's response to treatment, examination of patient, obtaining history from patient or surrogate, ordering and performing treatments and interventions, ordering and review of laboratory studies, ordering and review of radiographic studies, pulse oximetry and re-evaluation of patient's condition.     Final diagnoses:  Atrial fibrillation with RVR [facility])    ED Discharge Orders          Ordered    Ambulatory referral to Cardiology       Comments: If you have not heard from the Cardiology office within the next 72 hours please call [phone].   08/14/23 1047               Fireman Bernardino, MD 08/14/23 1047

## 2023-08-14 NOTE — ED Triage Notes (Signed)
 Patient arrived from home via POV with husband for shortness of breath and feeling like heart was racing.

## 2023-08-14 NOTE — ED Notes (Signed)
 Cardioversion timeout started at [phone] with Dr. Melvenia, RT, RN X 2. Medication pushed at 0943 (5.94 mL) etomidate. Shock delivered by Dr. Melvenia at [phone]. EKG at 0945. Pt began responding at [phone]. VSS at this time. PT A&OX 4 at this time. Pt pain 0/10 at this time. Pt rate controled at 71 in NS at this time.

## 2023-08-14 NOTE — ED Notes (Signed)
 Pt meets criteria for sedation end at this time. Refer to previous note for details.

## 2023-08-14 NOTE — Discharge Instructions (Addendum)
 Continue home medications as prescribed.  Follow-up with cardiology in office.  Return to the emergency department for any return of concerning symptoms.

## 2023-09-04 IMAGING — DX DG CHEST 2V
2 series · 2 of 2 positions shown · non-contrast
Comparison: Chest radiograph dated 03/28/2013.

CLINICAL DATA: Chest pain.

EXAM:
CHEST - 2 VIEW

[chest pa]
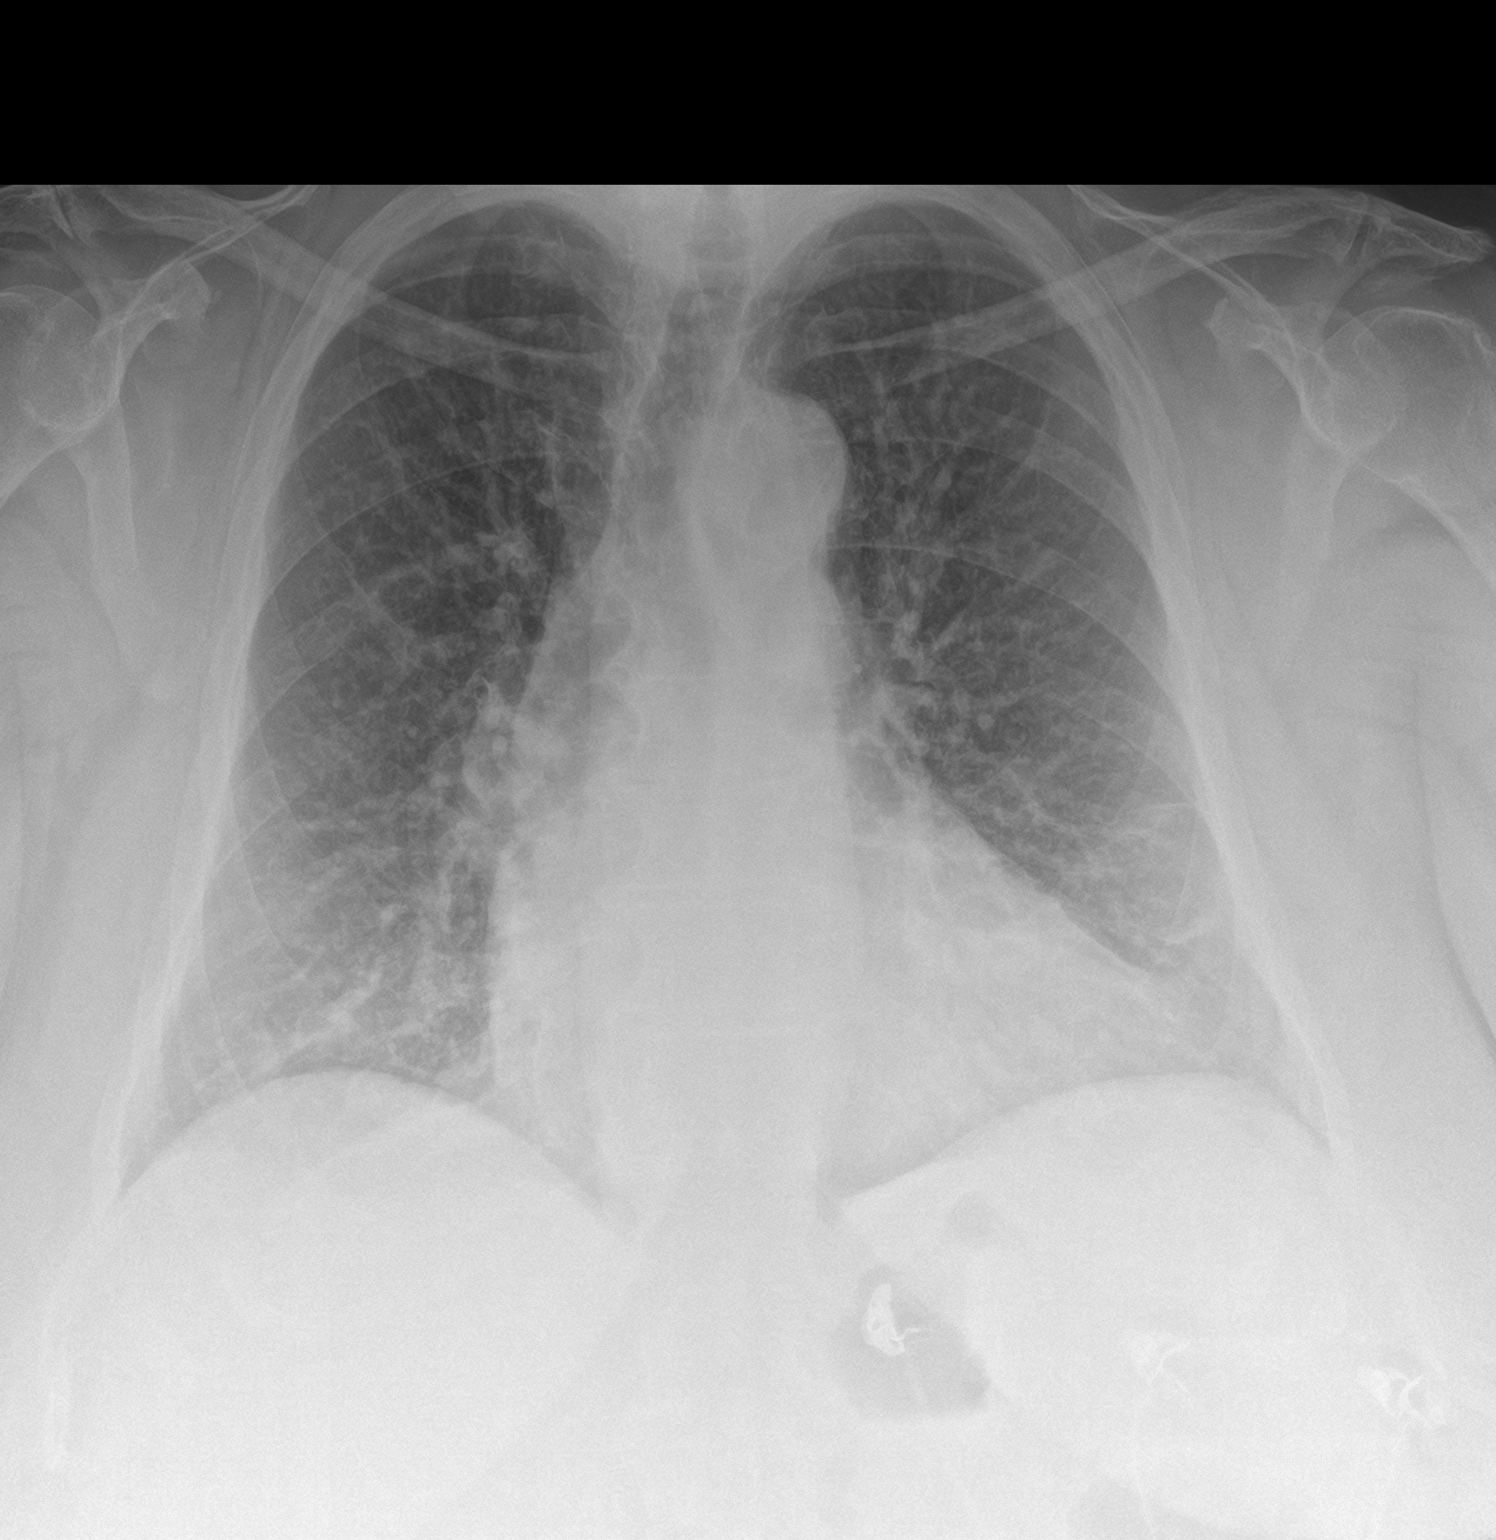

[chest lat]
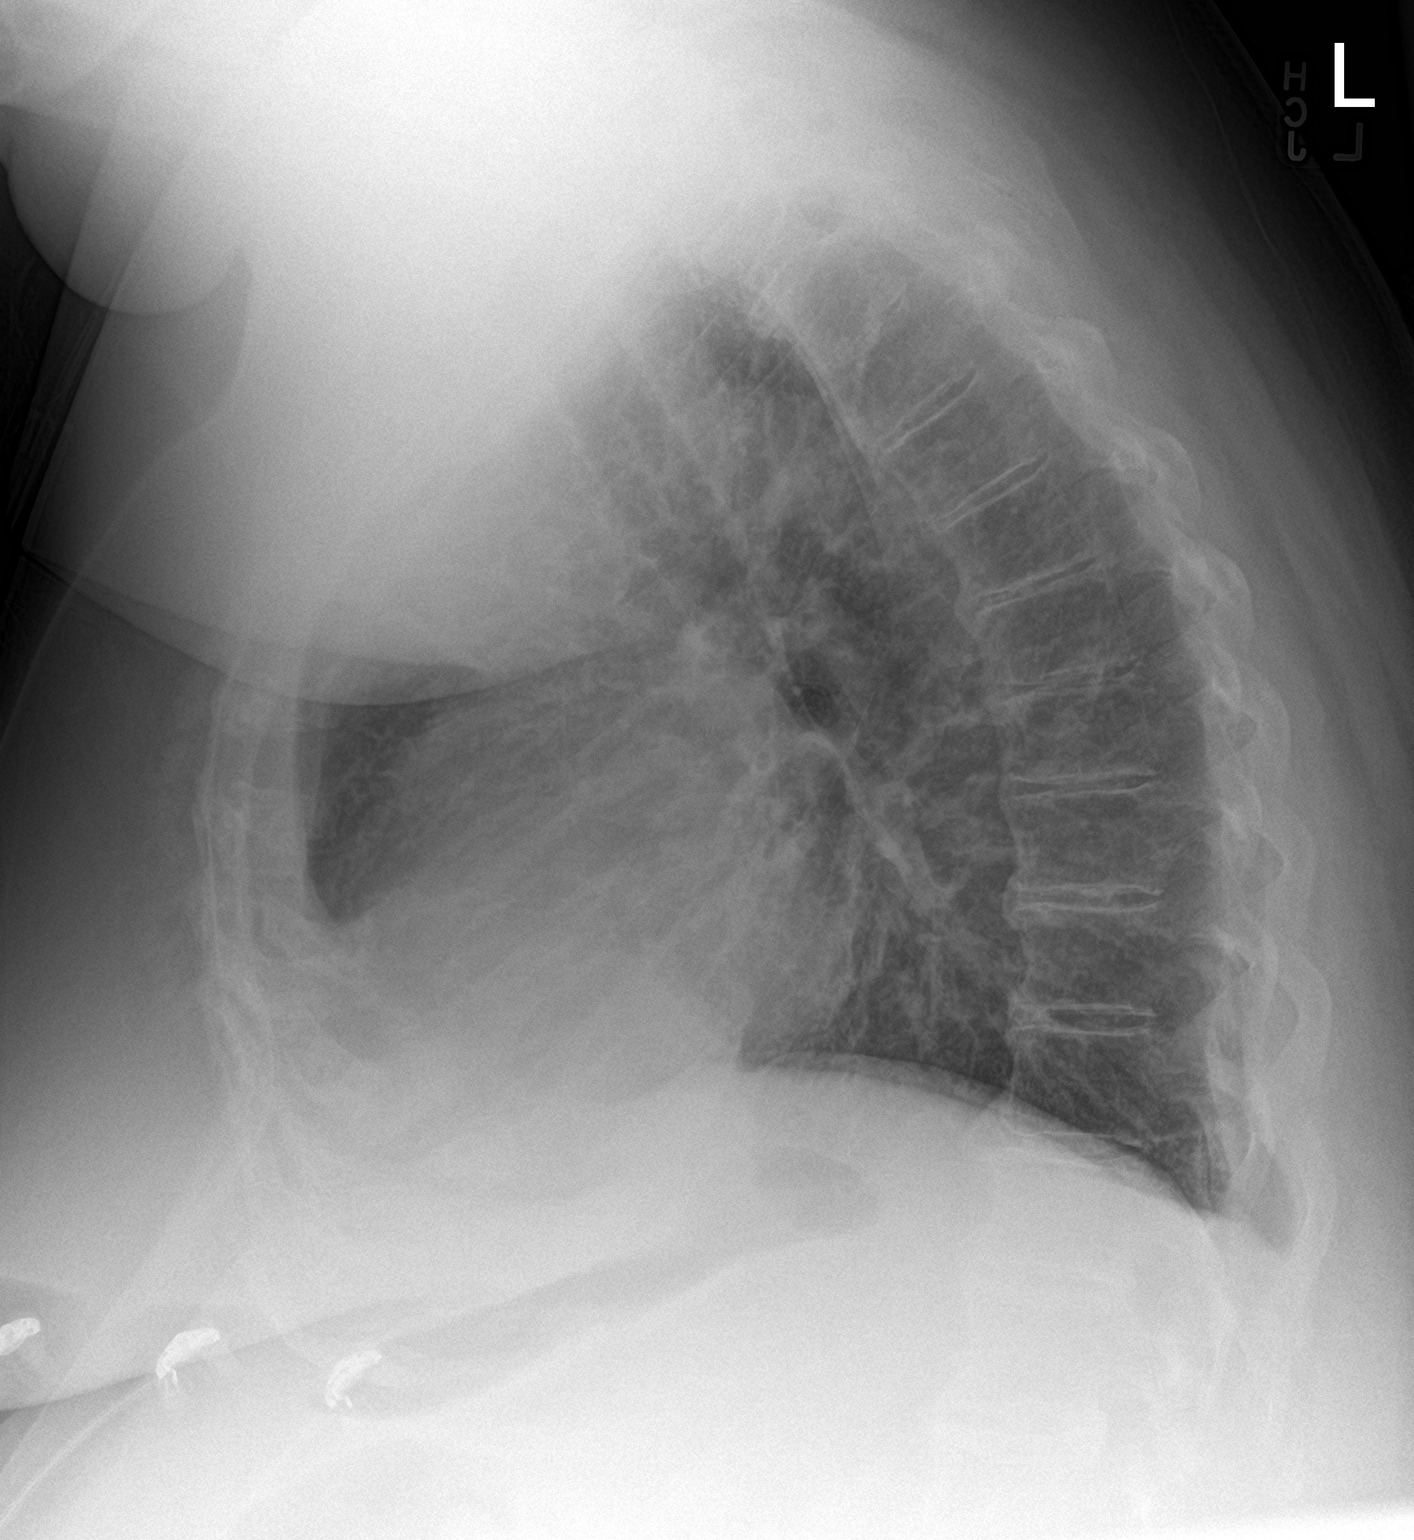

[2 of 2 positions shown; findings below may reference images not displayed]

FINDINGS: Cardiomegaly with vascular congestion. No focal consolidation,
pleural effusion, pneumothorax. No acute osseous pathology.
Degenerative changes of the spine.
IMPRESSION: Cardiomegaly with vascular congestion. No focal consolidation.

## 2023-09-20 ENCOUNTER — Ambulatory Visit: Admitting: Student

## 2023-11-06 NOTE — Progress Notes (Unsigned)
 Cardiology Office Note    Date:  11/07/2023  ID:  [name], DOB 07-10-1950, MRN [id number] Cardiologist: Jayson Sierras, MD Cardiology APP:  Johnson Laymon HERO, PA-C { : History of Present Illness:    [name] is a 73 y.o. female with past medical history of persistent atrial fibrillation (initially diagnosed in 2022, recurrence in 03/2023 and underwent DCCV), lower extremity edema and hypothyroidism who presents to the office today for follow-up from a recent Emergency Department visit.  She was last examined by Dr. Sierras in 05/2023 and denied any recent chest pain or palpitations at that time. She was continued on Cardizem  CD 240 mg daily, Toprol -XL 12.5 mg daily and Eliquis  5 mg twice daily for anticoagulation.  In the interim, she did present to [facility] ED on 08/14/2023 and reported new-onset palpitations which started the night prior to evaluation. She was found to be in recurrent atrial fibrillation with RVR with heart rate in the 140's and successful DCCV was performed at 200 J. She was monitored after the procedure and continued to maintain normal sinus rhythm and was discharged home.  In talking with the patient and her husband today, she denies any recurrent palpitations since her ER visit. Says her heart rate has overall been well-controlled when checked at home. She has noted elevated blood pressure over the past 2 to [redacted] weeks along with worsening lower extremity edema. Says that her weight has overall been stable on her home scales. Denies any specific orthopnea or PND. No exertional chest pain  She reports compliance with Eliquis  and has not missed any recent doses.  Studies Reviewed:   EKG: EKG is ordered today and demonstrates:   EKG Interpretation Date/Time:  Tuesday November 07 2023 11:24:47 EDT Ventricular Rate:  67 PR Interval:  162 QRS Duration:  106 QT Interval:  396 QTC Calculation: 418 R Axis:   -12  Text Interpretation: Normal sinus rhythm  Incomplete right bundle branch block Left ventricular hypertrophy with repolarization abnormality   Confirmed by Johnson Laymon ([id number]) on 11/07/2023 11:39:33 AM       Echocardiogram: 12/2020 IMPRESSIONS     1. Images are limited.   2. Left ventricular ejection fraction, by estimation, is 60 to 65%. The  left ventricle has normal function. The left ventricle has no regional  wall motion abnormalities. There is mild left ventricular hypertrophy.  Left ventricular diastolic parameters  are indeterminate.   3. Right ventricular systolic function is normal. The right ventricular  size is normal. Tricuspid regurgitation signal is inadequate for assessing  PA pressure.   4. Left atrial size was mildly dilated.   5. The mitral valve was not well visualized. Trivial mitral valve  regurgitation. No evidence of mitral stenosis. The mean mitral valve  gradient is 2.0 mmHg.   6. The aortic valve was not well visualized. Aortic valve regurgitation  is not visualized. Aortic valve mean gradient measures 9.0 mmHg.   Comparison(s): No prior Echocardiogram.   Risk Assessment/Calculations:    CHA2DS2-VASc Score = 3  This indicates a 3.2% annual risk of stroke. The patient's score is based upon: CHF History: 0 HTN History: 1 Diabetes History: 0 Stroke History: 0 Vascular Disease History: 0 Age Score: 1 Gender Score: 1   HYPERTENSION CONTROL Vitals:   11/07/23 1113 11/07/23 1157  BP: (!) 166/74 (!) 152/82    The patient's blood pressure is elevated above target today. In order to address the patient's elevated BP: A current anti-hypertensive medication was  adjusted today.       Physical Exam:   VS:  BP (!) 152/82   Pulse 67   Ht 5' 5 (1.651 m)   Wt 252 lb (114.3 kg)   SpO2 95%   BMI 41.93 kg/m    Wt Readings from Last 3 Encounters:  11/07/23 252 lb (114.3 kg)  08/14/23 262 lb (118.8 kg)  06/07/23 262 lb 9.6 oz (119.1 kg)     GEN: Well nourished, well developed  female appearing in no acute distress NECK: No JVD; No carotid bruits CARDIAC: RRR, no murmurs, rubs, gallops RESPIRATORY:  Clear to auscultation without rales, wheezing or rhonchi  ABDOMEN: Appears non-distended. No obvious abdominal masses. EXTREMITIES: No clubbing or cyanosis. 1+ pitting edema bilaterally.  Distal pedal pulses are 2+ bilaterally.   Assessment and Plan:   1. PAF (paroxysmal atrial fibrillation) (HCC) - She denies any recurrent palpitations since ED evaluation and is maintaining normal sinus rhythm by examination and EKG today. We reviewed that if she continues to have more frequent episodes, would recommend EP referral for consideration of antiarrhythmic options. For now, continue Cardizem  CD 240 mg daily and Toprol -XL 12.5 mg daily. - Continue Eliquis  5 mg twice daily for anticoagulation which is the appropriate dose given her current age, weight and renal function (creatinine at 0.66 when checked in 07/2023). CBC in 07/2023 showed her hemoglobin was stable at 13.5 with platelets at 295 K.   2. Essential hypertension - Blood pressure is elevated at 152/82 when checked personally today and has been elevated at home over the past few weeks.  BP is typically higher at night and improves throughout the day. She is currently taking Cardizem  CD 240 mg daily and Toprol -XL 12.5 mg daily. Will diurese with additional Lasix  as discussed below. If BP remains above goal, would restart Lisinopril  as she was on this previously in 2022. Continue Cardizem  CD 240 mg daily and Toprol -XL 12.5 mg daily at this time.  3. Lower Extremity Edema - She reports symptoms over the past 2 to 3 weeks and says that she was consuming almost 2 gallons of water but has recently reduced to 2 L/day. Has also been trying to limit her sodium intake. She has been taking Lasix  20 mg daily and will increase to 40 mg daily for 4 days then resume 20mg  daily. Encouraged to continue to limit fluid intake and sodium  intake.  Signed, Laymon CHRISTELLA Qua, PA-C

## 2023-11-07 ENCOUNTER — Ambulatory Visit: Attending: Student | Admitting: Student

## 2023-11-07 ENCOUNTER — Encounter: Payer: Self-pay | Admitting: Student

## 2023-11-07 VITALS — BP 152/82 | HR 67 | Ht 65.0 in | Wt 252.0 lb

## 2023-11-07 DIAGNOSIS — R600 Localized edema: Secondary | ICD-10-CM | POA: Insufficient documentation

## 2023-11-07 DIAGNOSIS — I10 Essential (primary) hypertension: Secondary | ICD-10-CM | POA: Insufficient documentation

## 2023-11-07 DIAGNOSIS — I480 Paroxysmal atrial fibrillation: Secondary | ICD-10-CM | POA: Insufficient documentation

## 2023-11-07 MED ORDER — FUROSEMIDE 20 MG PO TABS: ORAL_TABLET | ORAL | 1 refills | Status: AC

## 2023-11-07 NOTE — Patient Instructions (Signed)
 Medication Instructions:   Take Lasix  40 mg for 4 days then reduce to 20 mg Daily  Monitor Blood Pressure and bring recordings to the next visit   *If you need a refill on your cardiac medications before your next appointment, please call your pharmacy*  Lab Work: NONE   If you have labs (blood work) drawn today and your tests are completely normal, you will receive your results only by: MyChart Message (if you have MyChart) OR A paper copy in the mail If you have any lab test that is abnormal or we need to change your treatment, we will call you to review the results.  Testing/Procedures: NONE   Follow-Up: At [facility], you and your health needs are our priority.  As part of our continuing mission to provide you with exceptional heart care, our providers are all part of one team.  This team includes your primary Cardiologist (physician) and Advanced Practice Providers or APPs (Physician Assistants and Nurse Practitioners) who all work together to provide you with the care you need, when you need it.  Your next appointment:   2 -3 month(s)  Provider:   You may see Jayson Sierras, MD or one of the following Advanced Practice Providers on your designated Care Team:   Laymon Qua, PA-C  [place], [place] Olivia Pavy, [place]     We recommend signing up for the patient portal called MyChart.  Sign up information is provided on this After Visit Summary.  MyChart is used to connect with patients for Virtual Visits (Telemedicine).  Patients are able to view lab/test results, encounter notes, upcoming appointments, etc.  Non-urgent messages can be sent to your provider as well.   To learn more about what you can do with MyChart, go to [email or web address].   Other Instructions Thank you for choosing [facility]!

## 2023-11-08 ENCOUNTER — Telehealth: Payer: Self-pay | Admitting: Pharmacy Technician

## 2023-11-08 ENCOUNTER — Other Ambulatory Visit (HOSPITAL_COMMUNITY): Payer: Self-pay

## 2023-11-08 NOTE — Telephone Encounter (Signed)
  Patient Advocate Encounter   The patient was approved for a [employer] grant that will help cover the cost of ELIQUIS  Total amount awarded, 7500.  Effective: 10/09/23 - 10/07/24   APW:[id number] ERW:[id number] Group:[id number] PI:[id number] [employer] ID: [id number]   Pharmacy provided with approval and processing information. Patient informed via TELEPHONE

## 2023-11-09 ENCOUNTER — Telehealth: Payer: Self-pay | Admitting: *Deleted

## 2023-11-09 ENCOUNTER — Emergency Department (HOSPITAL_COMMUNITY)
Admission: EM | Admit: 2023-11-09 | Discharge: 2023-11-09 | Disposition: A | Discharge status: Home / Self Care | Attending: Emergency Medicine | Admitting: Emergency Medicine

## 2023-11-09 ENCOUNTER — Other Ambulatory Visit: Payer: Self-pay

## 2023-11-09 ENCOUNTER — Encounter (HOSPITAL_COMMUNITY): Payer: Self-pay

## 2023-11-09 ENCOUNTER — Emergency Department (HOSPITAL_COMMUNITY)

## 2023-11-09 DIAGNOSIS — I517 Cardiomegaly: Secondary | ICD-10-CM | POA: Diagnosis not present

## 2023-11-09 DIAGNOSIS — R600 Localized edema: Secondary | ICD-10-CM | POA: Insufficient documentation

## 2023-11-09 DIAGNOSIS — I4891 Unspecified atrial fibrillation: Secondary | ICD-10-CM | POA: Insufficient documentation

## 2023-11-09 DIAGNOSIS — Z79899 Other long term (current) drug therapy: Secondary | ICD-10-CM | POA: Diagnosis not present

## 2023-11-09 DIAGNOSIS — I110 Hypertensive heart disease with heart failure: Secondary | ICD-10-CM | POA: Diagnosis not present

## 2023-11-09 DIAGNOSIS — I480 Paroxysmal atrial fibrillation: Secondary | ICD-10-CM

## 2023-11-09 DIAGNOSIS — Z7901 Long term (current) use of anticoagulants: Secondary | ICD-10-CM | POA: Insufficient documentation

## 2023-11-09 DIAGNOSIS — R0602 Shortness of breath: Secondary | ICD-10-CM | POA: Diagnosis not present

## 2023-11-09 DIAGNOSIS — I509 Heart failure, unspecified: Secondary | ICD-10-CM | POA: Insufficient documentation

## 2023-11-09 LAB — CBC WITH DIFFERENTIAL/PLATELET
Abs Immature Granulocytes: 0.05 K/uL (ref 0.00–0.07)
Basophils Absolute: 0.1 K/uL (ref 0.0–0.1)
Basophils Relative: 0 %
Eosinophils Absolute: 0 K/uL (ref 0.0–0.5)
Eosinophils Relative: 0 %
HCT: 46.9 % — ABNORMAL HIGH (ref 36.0–46.0)
Hemoglobin: 15.2 g/dL — ABNORMAL HIGH (ref 12.0–15.0)
Immature Granulocytes: 0 %
Lymphocytes Relative: 11 %
Lymphs Abs: 1.5 K/uL (ref 0.7–4.0)
MCH: 26.5 pg (ref 26.0–34.0)
MCHC: 32.4 g/dL (ref 30.0–36.0)
MCV: 81.7 fL (ref 80.0–100.0)
Monocytes Absolute: 0.7 K/uL (ref 0.1–1.0)
Monocytes Relative: 5 %
Neutro Abs: 11.7 K/uL — ABNORMAL HIGH (ref 1.7–7.7)
Neutrophils Relative %: 84 %
Platelets: 338 K/uL (ref 150–400)
RBC: 5.74 MIL/uL — ABNORMAL HIGH (ref 3.87–5.11)
RDW: 17.1 % — ABNORMAL HIGH (ref 11.5–15.5)
WBC: 14.1 K/uL — ABNORMAL HIGH (ref 4.0–10.5)
nRBC: 0 % (ref 0.0–0.2)

## 2023-11-09 LAB — TROPONIN I (HIGH SENSITIVITY): Troponin I (High Sensitivity): 14 ng/L (ref <18)

## 2023-11-09 LAB — COMPREHENSIVE METABOLIC PANEL WITH GFR
ALT: 19 U/L (ref 0–44)
AST: 27 U/L (ref 15–41)
Albumin: 4.2 g/dL (ref 3.5–5.0)
Alkaline Phosphatase: 82 U/L (ref 38–126)
Anion gap: 15 (ref 5–15)
BUN: 16 mg/dL (ref 8–23)
CO2: 20 mmol/L — ABNORMAL LOW (ref 22–32)
Calcium: 9.5 mg/dL (ref 8.9–10.3)
Chloride: 103 mmol/L (ref 98–111)
Creatinine, Ser: 0.78 mg/dL (ref 0.44–1.00)
GFR, Estimated: >60 mL/min (ref >60)
Glucose, Bld: 131 mg/dL — ABNORMAL HIGH (ref 70–99)
Potassium: 4 mmol/L (ref 3.5–5.1)
Sodium: 138 mmol/L (ref 135–145)
Total Bilirubin: 0.8 mg/dL (ref 0.0–1.2)
Total Protein: 8.3 g/dL — ABNORMAL HIGH (ref 6.5–8.1)

## 2023-11-09 LAB — MAGNESIUM: Magnesium: 2.1 mg/dL (ref 1.7–2.4)

## 2023-11-09 MED ORDER — SODIUM CHLORIDE 0.9 % IV BOLUS
500.0000 mL | Freq: Once | INTRAVENOUS | Status: AC
  Administered 2023-11-09: 500 mL via INTRAVENOUS

## 2023-11-09 MED ORDER — METOPROLOL SUCCINATE ER 25 MG PO TB24: 25.0000 mg | ORAL_TABLET | Freq: Every day | ORAL | 3 refills | Status: DC

## 2023-11-09 MED ORDER — DILTIAZEM LOAD VIA INFUSION
10.0000 mg | Freq: Once | INTRAVENOUS | Status: AC
  Administered 2023-11-09: 10 mg via INTRAVENOUS
  Filled 2023-11-09: qty 10

## 2023-11-09 MED ORDER — METOPROLOL TARTRATE 25 MG PO TABS: 25.0000 mg | ORAL_TABLET | Freq: Three times a day (TID) | ORAL | 0 refills | Status: AC | PRN

## 2023-11-09 MED ORDER — DILTIAZEM HCL-DEXTROSE 125-5 MG/125ML-% IV SOLN (PREMIX)
5.0000 mg/h | INTRAVENOUS | Status: DC
  Administered 2023-11-09: 5 mg/h via INTRAVENOUS
  Filled 2023-11-09: qty 125

## 2023-11-09 NOTE — ED Triage Notes (Signed)
 Pt arrived via POV from home for concerns of recurrent atrial fibrillation. Pt reports presents SOB, denies CP, and reports taking all prescribed medications. Pt reports recent cardiology visit as well.

## 2023-11-09 NOTE — Discharge Instructions (Signed)
 If you feel the A-fib starting back up you can take the Lopressor .  Your Toprol  is being increased up to 25 mg today.  Follow-up with cardiology.

## 2023-11-09 NOTE — ED Provider Notes (Signed)
 [facility] AT [facility] Provider Note   CSN: [id number] Arrival date & time: 11/09/23  9057     Patient presents with: Atrial Fibrilation   [name] is a 73 y.o. female.   HPI Patient with history of paroxysmal A-fib.  CHA2DS2-VASc of 3.  On anticoagulation.  Felt A-fib started again last night.  Has been on Lasix  for increasing fluid on her legs.  No fevers has been compliant with her anticoagulation.  Reviewed cardiology notes and plan for EP follow-up if symptoms return.   Past Medical History:  Diagnosis Date   Arthritis    CHF (congestive heart failure) (HCC)    Hypertension    Lumbar disc herniation    hx. of. surgery 2'15   PAF (paroxysmal atrial fibrillation) (HCC)    a. diagnosed during admission in 12/2020 and spontaneously converted back to NSR    Prior to Admission medications   Medication Sig Start Date End Date Taking? Authorizing Provider  acetaminophen  (TYLENOL ) 500 MG tablet Take 1,000 mg by mouth every 6 (six) hours as needed for moderate pain (pain score 4-6).   Yes [provider]  apixaban  (ELIQUIS ) 5 MG TABS tablet Take 1 tablet (5 mg total) by mouth 2 (two) times daily. 01/01/21  Yes Shah, Pratik D, DO  calcium carbonate (TUMS - DOSED IN MG ELEMENTAL CALCIUM) 500 MG chewable tablet Chew 1 tablet by mouth daily.   Yes [provider]  diltiazem  (CARDIZEM  CD) 240 MG 24 hr capsule Take 240 mg by mouth at bedtime. 02/28/22  Yes [provider]  furosemide  (LASIX ) 20 MG tablet Take 2 tablets (40mg  daily) for 4 days then 1 tablet (20mg  daily). May take an extra 20mg  if needed for worsening edema. 11/07/23  Yes Strader, [place] M, PA-C  metoprolol  tartrate (LOPRESSOR ) 25 MG tablet Take 1 tablet (25 mg total) by mouth every 8 (eight) hours as needed (palpitations). 11/09/23  Yes Patsey Lot, MD  metoprolol  succinate (TOPROL  XL) 25 MG 24 hr tablet Take 1 tablet (25 mg total) by mouth daily.  11/09/23 11/08/24  Patsey Lot, MD    Allergies: Patient has no known allergies.    Review of Systems  Updated Vital Signs BP (!) 118/54   Pulse 69   Temp 98.1 F (36.7 C) (Oral)   Resp 16   Ht 5' 5 (1.651 m)   Wt 114.3 kg   SpO2 95%   BMI 41.93 kg/m   Physical Exam Vitals and nursing note reviewed.  Cardiovascular:     Rate and Rhythm: Tachycardia present. Rhythm irregular.  Pulmonary:     Breath sounds: No wheezing.  Abdominal:     Tenderness: There is no abdominal tenderness.  Musculoskeletal:        General: No tenderness.     Right lower leg: Edema present.     Left lower leg: Edema present.  Skin:    Capillary Refill: Capillary refill takes less than 2 seconds.  Neurological:     Mental Status: She is alert and oriented to person, place, and time.     (all labs ordered are listed, but only abnormal results are displayed) Labs Reviewed  CBC WITH DIFFERENTIAL/PLATELET - Abnormal; Notable for the following components:      Result Value   WBC 14.1 (*)    RBC 5.74 (*)    Hemoglobin 15.2 (*)    HCT 46.9 (*)    RDW 17.1 (*)    Neutro Abs 11.7 (*)  All other components within normal limits  COMPREHENSIVE METABOLIC PANEL WITH GFR - Abnormal; Notable for the following components:   CO2 20 (*)    Glucose, Bld 131 (*)    Total Protein 8.3 (*)    All other components within normal limits  MAGNESIUM   TROPONIN I (HIGH SENSITIVITY)  TROPONIN I (HIGH SENSITIVITY)    EKG: EKG Interpretation Date/Time:  Thursday November 09 2023 11:06:50 EDT Ventricular Rate:  79 PR Interval:  52 QRS Duration:  96 QT Interval:  358 QTC Calculation: 411 R Axis:   -49  Text Interpretation: Sinus rhythm Atrial premature complexes Short PR interval Left anterior fascicular block Abnormal R-wave progression, early transition LVH with secondary repolarization abnormality  A-fib has now resolved. Confirmed by Patsey Lot [phone]) on 11/09/2023 11:13:05 AM  Radiology: No  results found.   Procedures   Medications Ordered in the ED  diltiazem  (CARDIZEM ) 1 mg/mL load via infusion 10 mg (10 mg Intravenous Bolus from Bag 11/09/23 1045)    And  diltiazem  (CARDIZEM ) 125 mg in dextrose  5% 125 mL (1 mg/mL) infusion (0 mg/hr Intravenous Stopped 11/09/23 1116)  sodium chloride  0.9 % bolus 500 mL (500 mLs Intravenous Bolus 11/09/23 1109)                                    Medical Decision Making Amount and/or Complexity of Data Reviewed Labs: ordered. Radiology: ordered.  Risk Prescription drug management.   Patient with A-fib RVR.  History of same.  Tachycardic.  Began last night.  On Cardizem  drip here.  Patient did have conversion back to sinus rhythm.  Differential diagnose include causes of the A-fib or events that could precipitated and on her may be some over dehydration.  Hemoglobin is more elevated.  However patient did convert back to sinus and Cardizem  drip stopped.  Discussed with Dr. Alvan from cardiology.  Will increase her metoprolol  to 25 mg extended release and add as needed metoprolol .  Appears stable for discharge home however.  CRITICAL CARE Performed by: Lot Patsey Total critical care time: 30 minutes Critical care time was exclusive of separately billable procedures and treating other patients. Critical care was necessary to treat or prevent imminent or life-threatening deterioration. Critical care was time spent personally by me on the following activities: development of treatment plan with patient and/or surrogate as well as nursing, discussions with consultants, evaluation of patient's response to treatment, examination of patient, obtaining history from patient or surrogate, ordering and performing treatments and interventions, ordering and review of laboratory studies, ordering and review of radiographic studies, pulse oximetry and re-evaluation of patient's condition.      Final diagnoses:  None    ED Discharge Orders           Ordered    metoprolol  succinate (TOPROL  XL) 25 MG 24 hr tablet  Daily        11/09/23 1200    metoprolol  tartrate (LOPRESSOR ) 25 MG tablet  Every 8 hours PRN        11/09/23 1201               Patsey Lot, MD 11/09/23 1201

## 2023-11-09 NOTE — Telephone Encounter (Signed)
 Referral placed.

## 2023-11-09 NOTE — Telephone Encounter (Signed)
-----   Message from Laymon CHRISTELLA Qua sent at 11/09/2023 12:02 PM EDT ----- Regarding: EP Referral This patient needs an EP referral per Dr. Alvan for atrial fibrillation. Would refer to EP in [facility] for a sooner appointment and she might be a candidate for ablation.   Thanks,  [place]

## 2023-11-09 NOTE — ED Notes (Signed)
 Pt back in a NSR. Edp made aware. Cardizem  drip has been stopped. Rate controlled at 75 bpm.

## 2023-12-24 NOTE — Progress Notes (Deleted)
  Cardiology Office Note   Date:  12/24/2023  ID:  [name], DOB 10-Dec-1950, MRN [id number] PCP: Marvine Rush, MD  [facility] Providers Cardiologist:  Jayson Sierras, MD Cardiology APP:  Johnson Laymon HERO, PA-C  Electrophysiologist:  Donnice DELENA Primus, MD   History of Present Illness [name] is a 73 y.o. female with persistent AF and hypothyroidism who presents for AF management.  ROS: ***  Studies Reviewed  ECG review 11/09/23: (11:06:50), NSR 79, PR 52, QRS 96, QT/c 358/411 (post DCCV) 11/09/23: (10:17:10), AF/RVR 147, QRS 90, QT/c 308/482 (pre DCCV) 11/07/23: NSR 67 08/14/23: (09:45:06), NSR 84, PR 153, QRS 97, QT/c 355/420 (post DCCV) 08/14/23: (07:10:29), AF/RVR 142, QRS 91, QT/c 309/475 (pre DCCV)  03/31/23: (14:01:54), NSR 65 (post DCCV)  03/31/23: (12:35:09), AF/RVR 140 (pre DCCV)  03/30/23: AF/RVR 142 12/31/20: (21:51:20), NSR 70 12/31/20: (18:51:06), AF/RVR 140  TTE  Result date: 01/01/21  1. Images are limited.   2. Left ventricular ejection fraction, by estimation, is 60 to 65%. The  left ventricle has normal function. The left ventricle has no regional  wall motion abnormalities. There is mild left ventricular hypertrophy.  Left ventricular diastolic parameters  are indeterminate.   3. Right ventricular systolic function is normal. The right ventricular  size is normal. Tricuspid regurgitation signal is inadequate for assessing  PA pressure.   4. Left atrial size was mildly dilated.   5. The mitral valve was not well visualized. Trivial mitral valve  regurgitation. No evidence of mitral stenosis. The mean mitral valve  gradient is 2.0 mmHg.   6. The aortic valve was not well visualized. Aortic valve regurgitation  is not visualized. Aortic valve mean gradient measures 9.0 mmHg.   Risk Assessment/Calculations  CHA2DS2-VASc Score = 3  {Confirm score is correct.  If not, click here to update score.  REFRESH note.  :1} This  indicates a 3.2% annual risk of stroke. The patient's score is based upon: CHF History: 0 HTN History: 1 Diabetes History: 0 Stroke History: 0 Vascular Disease History: 0 Age Score: 1 Gender Score: 1   {This patient has a significant risk of stroke if diagnosed with atrial fibrillation.  Please consider VKA or DOAC agent for anticoagulation if the bleeding risk is acceptable.   You can also use the SmartPhrase .[email or web address] for documentation.   :[id number]} No BP recorded.  {Refresh Note OR Click here to enter BP  :1}***       Physical Exam VS:  There were no vitals taken for this visit.       Wt Readings from Last 3 Encounters:  11/09/23 252 lb (114.3 kg)  11/07/23 252 lb (114.3 kg)  08/14/23 262 lb (118.8 kg)    GEN: Well nourished, well developed in no acute distress NECK: No JVD; No carotid bruits CARDIAC: ***RRR, no murmurs, rubs, gallops RESPIRATORY:  Clear to auscultation without rales, wheezing or rhonchi  ABDOMEN: Soft, non-tender, non-distended EXTREMITIES:  No edema; No deformity   ASSESSMENT AND PLAN ***    {Are you ordering a CV Procedure (e.g. stress test, cath, DCCV, TEE, etc)?   Press F2        :[id number]}  Dispo: ***  Signed, Donnice DELENA Primus, MD

## 2023-12-26 ENCOUNTER — Ambulatory Visit: Admitting: Student in an Organized Health Care Education/Training Program

## 2023-12-28 ENCOUNTER — Institutional Professional Consult (permissible substitution): Admitting: Student in an Organized Health Care Education/Training Program

## 2024-01-05 ENCOUNTER — Ambulatory Visit (HOSPITAL_COMMUNITY)
Admission: RE | Admit: 2024-01-05 | Discharge: 2024-01-05 | Disposition: A | Discharge status: Home / Self Care | Source: Ambulatory Visit | Attending: Nurse Practitioner | Admitting: Nurse Practitioner

## 2024-01-05 DIAGNOSIS — E041 Nontoxic single thyroid nodule: Secondary | ICD-10-CM | POA: Diagnosis present

## 2024-01-08 ENCOUNTER — Ambulatory Visit: Payer: Self-pay | Admitting: Nurse Practitioner

## 2024-01-08 NOTE — Progress Notes (Signed)
 Please call patient and let her know that her repeat ultrasound shows stable nodule.  It has not grown since last imaging 6 months ago.  It is still recommending biopsy just based on the characteristics, but I think it may be reasonable to wait a bit longer and do another ultrasound in 6 more months to see if it changes.  If it grows, we will definitely need to biopsy.

## 2024-01-16 ENCOUNTER — Other Ambulatory Visit: Payer: Self-pay | Admitting: Nurse Practitioner

## 2024-01-16 ENCOUNTER — Encounter: Payer: Self-pay | Admitting: Student in an Organized Health Care Education/Training Program

## 2024-01-16 ENCOUNTER — Ambulatory Visit
Attending: Student in an Organized Health Care Education/Training Program | Admitting: Student in an Organized Health Care Education/Training Program

## 2024-01-16 VITALS — BP 150/74 | HR 78 | Ht 65.0 in | Wt 251.6 lb

## 2024-01-16 DIAGNOSIS — I480 Paroxysmal atrial fibrillation: Secondary | ICD-10-CM | POA: Insufficient documentation

## 2024-01-16 DIAGNOSIS — R7989 Other specified abnormal findings of blood chemistry: Secondary | ICD-10-CM

## 2024-01-16 DIAGNOSIS — E041 Nontoxic single thyroid nodule: Secondary | ICD-10-CM

## 2024-01-16 NOTE — Progress Notes (Unsigned)
 Cardiology Office Note   Date: 01/16/24 ID:  [name], DOB April 27, 1950, MRN [id number] PCP: No primary care provider on file.  [facility] Providers Cardiologist:  None Cardiology APP:  Johnson Laymon HERO, PA-C   History of Present Illness [name] is a 73 y.o. female with persistent AF, HTN and hypothyroidism presents for AF management. History of Present Illness [name] is a 73 year old female with atrial fibrillation who presents for follow-up regarding her heart rhythm management. She is accompanied by her husband, [name]. She was referred by Dr. Laymon [place] for evaluation of her heart rhythm issues.  She has a history of atrial fibrillation and is currently on Eliquis , which she takes twice daily. She has experienced episodes of tachycardia, with hospital visits in June and September. During the September episode, she managed to control it with medication, avoiding cardioversion. She is currently taking diltiazem  at night and metoprolol  succinate daily. Her blood pressure has decreased since making dietary changes.  She attributes her improvement to a significant reduction in salt intake, having eliminated processed foods and now cooking meals at home. She describes herself as having been a 'salt a holic' but has become 'fanatical about the salt' since realizing the high sodium content in frozen foods. Her edema has decreased significantly, with only minimal swelling remaining in her legs.  She has a history of two prior cardioversions but was able to avoid a third by using medication during her last episode.  She has a history of a negative experience with a stress test, where she felt the staff was inattentive, leading to a distressing situation where her heart 'detached'. This experience has left her apprehensive about undergoing future stress tests.  She lives with her husband, [name], and she has grown children who have moved away. 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  ROS: palpitations   Studies Reviewed  ECG review 11/09/23: (11:06:50), NSR 79, PR 52, QRS 96, QT/c 358/411 (post DCCV) 11/09/23: (10:17:10), AF/RVR 147, QRS 90, QT/c 308/482 (pre DCCV) 11/07/23: NSR 67, PR 162, QRS 106, QT/c 396/418 08/14/23: (09:45:06), NSR 84, PR 153, QRS 97, QT/c 355/420 (post DCCV) 08/14/23: (07:10:29), AF/RVR 142, QRS 91, QT/c 309/475 (pre DCCV)  03/31/23: (14:01:54), NSR 65, PR 134, QRS 94, QT/c 398/413 (post DCCV)  03/31/23: (12:35:09), AF/RVR 140, QRS 92, QT/c 262/399 (pre DCCV)  03/30/23: AF/RVR 142, QRS 94, QT/c 312/479 12/31/20: (21:51:20), NSR 70, PR 102, QRS 93, QT/c 385/416 12/31/20: (18:51:06), AF/RVR 140, QRS 90, QT/c 288/440   TTE  Result date: 01/01/21  1. Images are limited.   2. Left ventricular ejection fraction, by estimation, is 60 to 65%. The  left ventricle has normal function. The left ventricle has no regional  wall motion abnormalities. There is mild left ventricular hypertrophy.  Left ventricular diastolic parameters  are indeterminate.   3. Right ventricular systolic function is normal. The right ventricular  size is normal. Tricuspid regurgitation signal is inadequate for assessing  PA pressure.   4. Left atrial size was mildly dilated.   5. The mitral valve was not well visualized. Trivial mitral valve  regurgitation. No evidence of mitral stenosis. The mean mitral valve  gradient is 2.0 mmHg.   6. The aortic valve was not well visualized. Aortic valve regurgitation  is not visualized. Aortic valve mean gradient measures 9.0 mmHg.   Risk Assessment/Calculations  CHA2DS2-VASc Score = 3  This indicates a 3.2% annual risk  of stroke. The patient's score is based upon: CHF History: 0 HTN History: 1 Diabetes History: 0 Stroke History: 0 Vascular Disease History: 0 Age Score: 1 Gender Score: 1  Physical Exam VS:  BP (!) 150/74 (BP Location: Left Arm, Cuff  Size: Large)   Pulse 78   Ht 5' 5 (1.651 m)   Wt 251 lb 9.6 oz (114.1 kg)   SpO2 98%   BMI 41.87 kg/m   Wt Readings from Last 3 Encounters:  01/16/24 251 lb 9.6 oz (114.1 kg)  11/09/23 252 lb (114.3 kg)  11/07/23 252 lb (114.3 kg)    GEN: Well nourished, well developed in no acute distress NECK: No JVD; No carotid bruits CARDIAC: RRR, no murmurs, rubs, gallops RESPIRATORY:  Clear to auscultation without rales, wheezing or rhonchi  EXTREMITIES:  No edema; No deformity   ASSESSMENT AND PLAN [name] is a 73 y.o. female with persistent AF, HTN and hypothyroidism presents for AF management. Assessment & Plan Atrial fibrillation Intermittent AFib managed with medication. Asymptomatic with good heart function. Discussed progression to persistent AFib and treatment options. She prefers current management and lifestyle modifications.  Reviewed options for rhythm management with Qtc reasonable for Tikosyn, flecainide would be also reasonable without any known CAD.  For now she does not want to start any antiarrhythmic medications if she feels like her AF is paroxysmal and not significantly burdensome. - Continue Eliquis , diltiazem , and metoprolol . - Monitor for increased frequency or severity of AFib episodes. - Consider ablation or antiarrhythmic medication if episodes become more frequent or severe.  Hypertension Blood pressure improved with dietary changes. Well-controlled with occasional spikes due to white coat syndrome. - Continue current dietary modifications to reduce salt intake. - Monitor blood pressure regularly.  A total of 30 minutes was spent preparing for the patient, reviewing history, performing exam, document encounter, coordinating care and counseling the patient. 19 minutes was spent with direct patient care.   Dispo: RTC 6 months   Signed, Donnice DELENA Primus, MD

## 2024-01-16 NOTE — Progress Notes (Signed)
 I ordered the ultrasound (as future so they wont try to schedule her now).  I also ordered follow up labs.  Can you reach out and schedule her for a 6 month follow up with US  and previsit labs?  Thanks!

## 2024-01-16 NOTE — Progress Notes (Signed)
 Patient was called and given her results per Benton Rio, NP. Patient is in agreement to wait another 6 months, repeat scan and see what it shows then precede with the next steps.

## 2024-01-16 NOTE — Patient Instructions (Signed)
 Medication Instructions:  Your physician recommends that you continue on your current medications as directed. Please refer to the Current Medication list given to you today.  *If you need a refill on your cardiac medications before your next appointment, please call your pharmacy*  Lab Work: NONE   If you have labs (blood work) drawn today and your tests are completely normal, you will receive your results only by: MyChart Message (if you have MyChart) OR A paper copy in the mail If you have any lab test that is abnormal or we need to change your treatment, we will call you to review the results.  Testing/Procedures: NONE   Follow-Up: At [facility], you and your health needs are our priority.  As part of our continuing mission to provide you with exceptional heart care, our providers are all part of one team.  This team includes your primary Cardiologist (physician) and Advanced Practice Providers or APPs (Physician Assistants and Nurse Practitioners) who all work together to provide you with the care you need, when you need it.  Your next appointment:   6 month(s)  Provider:   Laymon Qua, PA-C    We recommend signing up for the patient portal called MyChart.  Sign up information is provided on this After Visit Summary.  MyChart is used to connect with patients for Virtual Visits (Telemedicine).  Patients are able to view lab/test results, encounter notes, upcoming appointments, etc.  Non-urgent messages can be sent to your provider as well.   To learn more about what you can do with MyChart, go to [email or web address].   Other Instructions Thank you for choosing [facility]!

## 2024-01-17 NOTE — Progress Notes (Signed)
 done

## 2024-01-28 NOTE — Progress Notes (Unsigned)
 Cardiology Office Note    Date:  01/30/2024  ID:  [name], DOB 06/16/1950, MRN [id number] Cardiologist: Jayson Sierras, MD Cardiology APP:  Johnson Laymon HERO, PA-C { :  History of Present Illness:    [name] is a 73 y.o. female with past medical history of paroxysmal atrial fibrillation (initially diagnosed in 2022, recurrence in 03/2023 and underwent DCCV, repeat DCCV in 07/2023), lower extremity edema and hypothyroidism who presents to the office today for 25-month follow-up.   She was examined by myself in 10/2023 and had recently been evaluated in the ED for recurrent atrial fibrillation and underwent successful DCCV. At the time of follow-up, she reported doing well from a rhythm perspective but was having issues with elevated blood pressure. It was recommended if she continued to have recurrent episodes of atrial fibrillation, would recommend EP referral for consideration of antiarrhythmic options. She was continued on Cardizem  CD 240 mg daily, Toprol -XL 12.5 mg daily and Eliquis  for anticoagulation. She was encouraged to keep a BP log and if this remained elevated, would restart Lisinopril  as she was previously on this in 2022. She had also experienced issues with lower extremity edema and Lasix  was titrated to 40 mg daily for 4 days then resume at 20 mg daily.  She was evaluated again at [facility] ED on 11/09/2023 for palpitations and was found to be in atrial fibrillation with RVR and converted back to normal sinus rhythm with IV Cardizem . She was also found to be dehydrated. Given her recurrent arrhythmias, EP referral was recommended and she met with Dr. Almetta on 01/16/2024. She had significantly reduced her sodium intake. Options in regards to management were reviewed and it was felt that Tikosyn or Flecainide would be reasonable but she did not wish to start antiarrhythmic medications at that time. Was recommended to consider antiarrhythmic medications or ablation if  symptoms became more frequent or severe.  In talking with the patient and her husband today, she reports overall doing well since her ED evaluation in 10/2023. No recurrent palpitations resembling her atrial fibrillation. Reports her breathing has been stable and no recent exertional chest pain. No specific orthopnea or PND. She has made significant dietary changes and is no longer consuming processed food or frozen meals. By reducing her sodium intake, she reports her lower extremity edema has significantly improved and she has only had to take an extra Lasix  on one occasion. Blood pressure has also improved and is at 120/68 during today's visit and she reports similar readings when checked at home.  Studies Reviewed:   EKG: EKG is not ordered today.  Echocardiogram: 12/2020 IMPRESSIONS     1. Images are limited.   2. Left ventricular ejection fraction, by estimation, is 60 to 65%. The  left ventricle has normal function. The left ventricle has no regional  wall motion abnormalities. There is mild left ventricular hypertrophy.  Left ventricular diastolic parameters  are indeterminate.   3. Right ventricular systolic function is normal. The right ventricular  size is normal. Tricuspid regurgitation signal is inadequate for assessing  PA pressure.   4. Left atrial size was mildly dilated.   5. The mitral valve was not well visualized. Trivial mitral valve  regurgitation. No evidence of mitral stenosis. The mean mitral valve  gradient is 2.0 mmHg.   6. The aortic valve was not well visualized. Aortic valve regurgitation  is not visualized. Aortic valve mean gradient measures 9.0 mmHg.   Comparison(s): No prior Echocardiogram.  Risk Assessment/Calculations:   CHA2DS2-VASc Score = 3  This indicates a 3.2% annual risk of stroke. The patient's score is based upon: CHF History: 0 HTN History: 1 Diabetes History: 0 Stroke History: 0 Vascular Disease History: 0 Age Score: 1 Gender  Score: 1   Physical Exam:   VS:  BP 120/68   Pulse 70   Ht 5' 5 (1.651 m)   Wt 252 lb 6.4 oz (114.5 kg)   SpO2 96%   BMI 42.00 kg/m    Wt Readings from Last 3 Encounters:  01/30/24 252 lb 6.4 oz (114.5 kg)  01/16/24 251 lb 9.6 oz (114.1 kg)  11/09/23 252 lb (114.3 kg)     GEN: Pleasant female appearing in no acute distress NECK: No JVD; No carotid bruits CARDIAC: RRR, no murmurs, rubs, gallops RESPIRATORY:  Clear to auscultation without rales, wheezing or rhonchi  ABDOMEN: Appears non-distended. No obvious abdominal masses. EXTREMITIES: No clubbing or cyanosis. Trace lower extremity edema bilaterally.  Distal pedal pulses are 2+ bilaterally.   Assessment and Plan:   1. Paroxysmal atrial fibrillation (HCC) - She was previously evaluated by EP earlier this month given episodes of atrial fibrillation and preferred watchful waiting at that time as compared to antiarrhythmic therapy or ablation. I encouraged her to make us  aware if she has recurrent symptoms in the future. For now, continue Cardizem  CD 240 mg daily, Toprol -XL 25 mg daily and short-acting Lopressor  25 mg to take as needed for palpitations. - No reports of active bleeding. Continue Eliquis  5 mg twice daily for anticoagulation which is the appropriate dose given her current age (73 years old), weight (252 lbs) and renal function (creatinine at 0.78 when checked in 10/2023). CBC in 10/2023 showed her hemoglobin was stable at 15.2 with platelets at 338 K.   2. Essential hypertension - Blood pressure has improved with dietary changes. BP is well-controlled at 120/68 during today's visit. Continue current medical therapy with Cardizem  CD 240 mg daily and Toprol -XL 25 mg daily.  3. Bilateral lower extremity edema - Her symptoms have significantly improved with limiting the sodium intake in her diet. Continue with Lasix  20 mg daily and she is able to take an extra 20 mg if needed for worsening edema.  Signed, Laymon CHRISTELLA Qua, PA-C

## 2024-01-30 ENCOUNTER — Ambulatory Visit: Attending: Student | Admitting: Student

## 2024-01-30 ENCOUNTER — Encounter: Payer: Self-pay | Admitting: *Deleted

## 2024-01-30 ENCOUNTER — Encounter: Payer: Self-pay | Admitting: Student

## 2024-01-30 VITALS — BP 120/68 | HR 70 | Ht 65.0 in | Wt 252.4 lb

## 2024-01-30 DIAGNOSIS — R600 Localized edema: Secondary | ICD-10-CM | POA: Diagnosis present

## 2024-01-30 DIAGNOSIS — I10 Essential (primary) hypertension: Secondary | ICD-10-CM

## 2024-01-30 DIAGNOSIS — I480 Paroxysmal atrial fibrillation: Secondary | ICD-10-CM | POA: Insufficient documentation

## 2024-01-30 MED ORDER — DILTIAZEM HCL ER COATED BEADS 240 MG PO CP24: 240.0000 mg | ORAL_CAPSULE | Freq: Every day | ORAL | 3 refills | Status: AC

## 2024-01-30 MED ORDER — APIXABAN 5 MG PO TABS: 5.0000 mg | ORAL_TABLET | Freq: Two times a day (BID) | ORAL | 11 refills | Status: AC

## 2024-01-30 MED ORDER — METOPROLOL SUCCINATE ER 25 MG PO TB24: 25.0000 mg | ORAL_TABLET | Freq: Every day | ORAL | 3 refills | Status: AC

## 2024-01-30 NOTE — Patient Instructions (Signed)
 Medication Instructions:  Your physician recommends that you continue on your current medications as directed. Please refer to the Current Medication list given to you today.  *If you need a refill on your cardiac medications before your next appointment, please call your pharmacy*  Lab Work: NONE   If you have labs (blood work) drawn today and your tests are completely normal, you will receive your results only by: MyChart Message (if you have MyChart) OR A paper copy in the mail If you have any lab test that is abnormal or we need to change your treatment, we will call you to review the results.  Testing/Procedures: NONE   Follow-Up: At [facility], you and your health needs are our priority.  As part of our continuing mission to provide you with exceptional heart care, our providers are all part of one team.  This team includes your primary Cardiologist (physician) and Advanced Practice Providers or APPs (Physician Assistants and Nurse Practitioners) who all work together to provide you with the care you need, when you need it.  Your next appointment:   6 month(s)  Provider:   You may see Nona Dell, MD or one of the following Advanced Practice Providers on your designated Care Team:   Randall An, PA-C  [place], [place] Jacolyn Reedy, [place]     We recommend signing up for the patient portal called "MyChart".  Sign up information is provided on this After Visit Summary.  MyChart is used to connect with patients for Virtual Visits (Telemedicine).  Patients are able to view lab/test results, encounter notes, upcoming appointments, etc.  Non-urgent messages can be sent to your provider as well.   To learn more about what you can do with MyChart, go to [email or web address].   Other Instructions Thank you for choosing [facility]!

## 2024-07-12 ENCOUNTER — Other Ambulatory Visit (HOSPITAL_COMMUNITY)

## 2024-07-17 ENCOUNTER — Ambulatory Visit: Admitting: Nurse Practitioner
